# Patient Record
Sex: Female | Born: 1955 | Race: White | Hispanic: No | Marital: Married | Smoking: Never smoker
Health system: Southern US, Community
[De-identification: ages and names within clinical notes are randomized; demographics above are authoritative.]

## PROBLEM LIST (undated history)

## (undated) DIAGNOSIS — E079 Disorder of thyroid, unspecified: Secondary | ICD-10-CM

## (undated) DIAGNOSIS — E042 Nontoxic multinodular goiter: Secondary | ICD-10-CM

## (undated) DIAGNOSIS — Z973 Presence of spectacles and contact lenses: Secondary | ICD-10-CM

## (undated) HISTORY — PX: TUBAL LIGATION: SHX77

## (undated) HISTORY — PX: CHOLECYSTECTOMY: SHX55

## (undated) HISTORY — PX: TONSILLECTOMY: SUR1361

## (undated) HISTORY — PX: COLONOSCOPY: SHX174

## (undated) HISTORY — PX: LIPOMA EXCISION: SHX5283

---

## 1999-06-01 ENCOUNTER — Encounter: Payer: Self-pay | Admitting: Obstetrics and Gynecology

## 1999-06-01 ENCOUNTER — Encounter: Admission: RE | Admit: 1999-06-01 | Discharge: 1999-06-01 | Payer: Self-pay | Admitting: Obstetrics and Gynecology

## 2000-08-24 ENCOUNTER — Encounter: Admission: RE | Admit: 2000-08-24 | Discharge: 2000-08-24 | Payer: Self-pay | Admitting: Obstetrics and Gynecology

## 2000-08-24 ENCOUNTER — Encounter: Payer: Self-pay | Admitting: Obstetrics and Gynecology

## 2001-09-01 ENCOUNTER — Encounter: Payer: Self-pay | Admitting: Obstetrics and Gynecology

## 2001-09-01 ENCOUNTER — Encounter: Admission: RE | Admit: 2001-09-01 | Discharge: 2001-09-01 | Payer: Self-pay | Admitting: Obstetrics and Gynecology

## 2002-09-18 ENCOUNTER — Encounter: Admission: RE | Admit: 2002-09-18 | Discharge: 2002-09-18 | Payer: Self-pay | Admitting: Obstetrics and Gynecology

## 2002-09-18 ENCOUNTER — Encounter: Payer: Self-pay | Admitting: Obstetrics and Gynecology

## 2003-02-18 ENCOUNTER — Ambulatory Visit (HOSPITAL_COMMUNITY): Admission: RE | Admit: 2003-02-18 | Discharge: 2003-02-18 | Payer: Self-pay | Admitting: Family Medicine

## 2004-12-14 ENCOUNTER — Ambulatory Visit (HOSPITAL_COMMUNITY): Admission: RE | Admit: 2004-12-14 | Discharge: 2004-12-14 | Payer: Self-pay | Admitting: Family Medicine

## 2005-05-03 ENCOUNTER — Ambulatory Visit (HOSPITAL_COMMUNITY): Admission: RE | Admit: 2005-05-03 | Discharge: 2005-05-03 | Payer: Self-pay | Admitting: Pulmonary Disease

## 2005-05-04 ENCOUNTER — Ambulatory Visit (HOSPITAL_COMMUNITY): Admission: RE | Admit: 2005-05-04 | Discharge: 2005-05-04 | Payer: Self-pay | Admitting: Pulmonary Disease

## 2005-05-12 ENCOUNTER — Encounter (HOSPITAL_COMMUNITY): Admission: RE | Admit: 2005-05-12 | Discharge: 2005-06-11 | Payer: Self-pay | Admitting: Family Medicine

## 2005-08-02 ENCOUNTER — Encounter: Admission: RE | Admit: 2005-08-02 | Discharge: 2005-08-02 | Payer: Self-pay | Admitting: Endocrinology

## 2005-08-02 ENCOUNTER — Encounter (INDEPENDENT_AMBULATORY_CARE_PROVIDER_SITE_OTHER): Payer: Self-pay | Admitting: Specialist

## 2005-08-02 ENCOUNTER — Other Ambulatory Visit: Admission: RE | Admit: 2005-08-02 | Discharge: 2005-08-02 | Payer: Self-pay | Admitting: Interventional Radiology

## 2005-12-17 ENCOUNTER — Ambulatory Visit (HOSPITAL_COMMUNITY): Admission: RE | Admit: 2005-12-17 | Discharge: 2005-12-17 | Payer: Self-pay | Admitting: Family Medicine

## 2005-12-23 ENCOUNTER — Ambulatory Visit (HOSPITAL_COMMUNITY): Admission: RE | Admit: 2005-12-23 | Discharge: 2005-12-23 | Payer: Self-pay | Admitting: Family Medicine

## 2005-12-23 ENCOUNTER — Encounter: Payer: Self-pay | Admitting: Orthopedic Surgery

## 2005-12-30 ENCOUNTER — Ambulatory Visit: Payer: Self-pay | Admitting: Orthopedic Surgery

## 2006-01-27 ENCOUNTER — Ambulatory Visit: Payer: Self-pay | Admitting: Orthopedic Surgery

## 2006-08-17 ENCOUNTER — Encounter: Admission: RE | Admit: 2006-08-17 | Discharge: 2006-08-17 | Payer: Self-pay | Admitting: Endocrinology

## 2006-12-13 IMAGING — NM NM THYROID IMAGING W/ UPTAKE SINGLE (24 HR)
4 series · 4 of 4 positions shown · non-contrast
Comparison: No prior nuclear studies.

CLINICAL DATA: Right thyroid mass. 
 NUCLEAR MEDICINE THYROID SCAN AND THYROID RADIOIODINE UPTAKE:
TECHNIQUE: Following orally administered L-LRL, thyroid radioiodine uptake was calculated at 24 hours.  After intravenous injection of 1c-WWm pertechnetate, thyroid scan was performed.
 Radiopharmaceuticals:  15 uCi L-LRL and 10 mCi 1c-WWm pertechnetate.

[Series 1: th thyroid scan · 1.09mm/px · 1 of 1 slices shown (1 of 4)]
[im 1/1]
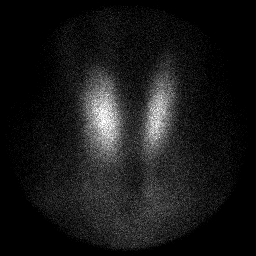

[Series 1: th thyroid scan · 2.39mm/px · 1 of 1 slices shown (2 of 4)]
[im 1/1]
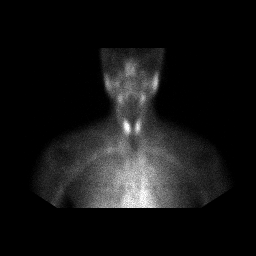

[Series 1: th thyroid scan · 1.09mm/px · 1 of 1 slices shown (3 of 4)]
[im 1/1]
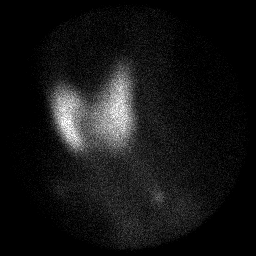

[Series 1: th thyroid scan · 1.09mm/px · 1 of 1 slices shown (4 of 4)]
[im 1/1]
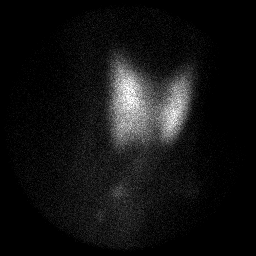

[4 of 4 positions shown; findings below may reference images not displayed]

Today?s exam is correlated with an ultrasound dated 05/12/05, and a CT of the C-spine dated 05/04/05.
FINDINGS: Radioactive iodine uptake is measured at 13.9 percent, 24 hours following L-LRL.  Normal is 10-35 percent.   
 The anatomical scan shows no focal masses, hot or cold.  Specifically, the dominant nodule in the lower aspect of the right lobe, is not visible on this exam.  It is highly unlikely for a nodule that is not cold on the scan, to be malignant.  
 The other smaller nodules are also not visible on this exam.
IMPRESSION: 1.   Radioactive iodine uptake is within normal limits.  
 2.  The anatomical scan shows no hot or cold nodules.  Specifically, the dominant nodule in the lower aspect of the right lobe, is not cold on this scan. See report.

## 2007-05-31 ENCOUNTER — Ambulatory Visit (HOSPITAL_COMMUNITY): Admission: RE | Admit: 2007-05-31 | Discharge: 2007-05-31 | Payer: Self-pay | Admitting: Family Medicine

## 2007-06-06 ENCOUNTER — Ambulatory Visit (HOSPITAL_COMMUNITY): Admission: RE | Admit: 2007-06-06 | Discharge: 2007-06-06 | Payer: Self-pay | Admitting: Internal Medicine

## 2007-06-28 ENCOUNTER — Ambulatory Visit: Payer: Self-pay | Admitting: Orthopedic Surgery

## 2007-06-28 DIAGNOSIS — M79609 Pain in unspecified limb: Secondary | ICD-10-CM | POA: Insufficient documentation

## 2007-06-29 ENCOUNTER — Telehealth: Payer: Self-pay | Admitting: Orthopedic Surgery

## 2007-07-05 ENCOUNTER — Telehealth: Payer: Self-pay | Admitting: Orthopedic Surgery

## 2007-07-06 ENCOUNTER — Encounter: Payer: Self-pay | Admitting: Orthopedic Surgery

## 2007-07-06 ENCOUNTER — Ambulatory Visit (HOSPITAL_COMMUNITY): Admission: RE | Admit: 2007-07-06 | Discharge: 2007-07-06 | Payer: Self-pay | Admitting: Orthopedic Surgery

## 2007-07-19 ENCOUNTER — Ambulatory Visit: Payer: Self-pay | Admitting: Orthopedic Surgery

## 2007-08-02 ENCOUNTER — Ambulatory Visit: Payer: Self-pay | Admitting: Orthopedic Surgery

## 2009-04-15 ENCOUNTER — Encounter: Admission: RE | Admit: 2009-04-15 | Discharge: 2009-04-15 | Payer: Self-pay | Admitting: Endocrinology

## 2009-11-17 ENCOUNTER — Ambulatory Visit (HOSPITAL_COMMUNITY): Admission: RE | Admit: 2009-11-17 | Discharge: 2009-11-17 | Payer: Self-pay | Admitting: Family Medicine

## 2010-03-15 ENCOUNTER — Encounter: Payer: Self-pay | Admitting: Orthopedic Surgery

## 2010-05-14 ENCOUNTER — Other Ambulatory Visit (HOSPITAL_COMMUNITY): Payer: Self-pay | Admitting: Endocrinology

## 2010-05-14 DIAGNOSIS — M8580 Other specified disorders of bone density and structure, unspecified site: Secondary | ICD-10-CM

## 2010-05-19 ENCOUNTER — Ambulatory Visit (HOSPITAL_COMMUNITY)
Admission: RE | Admit: 2010-05-19 | Discharge: 2010-05-19 | Disposition: A | Discharge status: Home / Self Care | Payer: BC Managed Care – PPO | Source: Ambulatory Visit | Attending: Endocrinology | Admitting: Endocrinology

## 2010-05-19 ENCOUNTER — Encounter (HOSPITAL_COMMUNITY): Payer: Self-pay

## 2010-05-19 DIAGNOSIS — M8580 Other specified disorders of bone density and structure, unspecified site: Secondary | ICD-10-CM

## 2010-05-19 DIAGNOSIS — Z780 Asymptomatic menopausal state: Secondary | ICD-10-CM | POA: Insufficient documentation

## 2010-05-19 DIAGNOSIS — M949 Disorder of cartilage, unspecified: Secondary | ICD-10-CM | POA: Insufficient documentation

## 2010-05-19 DIAGNOSIS — M899 Disorder of bone, unspecified: Secondary | ICD-10-CM | POA: Insufficient documentation

## 2010-12-21 ENCOUNTER — Other Ambulatory Visit (HOSPITAL_COMMUNITY): Payer: Self-pay | Admitting: Family Medicine

## 2010-12-21 ENCOUNTER — Ambulatory Visit (HOSPITAL_COMMUNITY)
Admission: RE | Admit: 2010-12-21 | Discharge: 2010-12-21 | Disposition: A | Discharge status: Home / Self Care | Payer: BC Managed Care – PPO | Source: Ambulatory Visit | Attending: Family Medicine | Admitting: Family Medicine

## 2010-12-21 DIAGNOSIS — M25549 Pain in joints of unspecified hand: Secondary | ICD-10-CM

## 2010-12-21 DIAGNOSIS — M19049 Primary osteoarthritis, unspecified hand: Secondary | ICD-10-CM

## 2011-02-03 ENCOUNTER — Other Ambulatory Visit (HOSPITAL_COMMUNITY): Payer: Self-pay | Admitting: Family Medicine

## 2011-02-03 DIAGNOSIS — J309 Allergic rhinitis, unspecified: Secondary | ICD-10-CM

## 2011-02-04 ENCOUNTER — Ambulatory Visit (HOSPITAL_COMMUNITY)
Admission: RE | Admit: 2011-02-04 | Discharge: 2011-02-04 | Disposition: A | Discharge status: Home / Self Care | Payer: BC Managed Care – PPO | Source: Ambulatory Visit | Attending: Family Medicine | Admitting: Family Medicine

## 2011-02-04 DIAGNOSIS — R059 Cough, unspecified: Secondary | ICD-10-CM | POA: Insufficient documentation

## 2011-02-04 DIAGNOSIS — R0989 Other specified symptoms and signs involving the circulatory and respiratory systems: Secondary | ICD-10-CM | POA: Insufficient documentation

## 2011-02-04 DIAGNOSIS — J309 Allergic rhinitis, unspecified: Secondary | ICD-10-CM

## 2011-02-04 DIAGNOSIS — R05 Cough: Secondary | ICD-10-CM | POA: Insufficient documentation

## 2011-02-19 ENCOUNTER — Ambulatory Visit (HOSPITAL_COMMUNITY)
Admission: RE | Admit: 2011-02-19 | Discharge: 2011-02-19 | Disposition: A | Discharge status: Home / Self Care | Payer: BC Managed Care – PPO | Source: Ambulatory Visit | Attending: Family Medicine | Admitting: Family Medicine

## 2011-02-19 ENCOUNTER — Other Ambulatory Visit (HOSPITAL_COMMUNITY): Payer: Self-pay | Admitting: Family Medicine

## 2011-02-19 DIAGNOSIS — S90129A Contusion of unspecified lesser toe(s) without damage to nail, initial encounter: Secondary | ICD-10-CM

## 2011-02-19 DIAGNOSIS — M79609 Pain in unspecified limb: Secondary | ICD-10-CM | POA: Insufficient documentation

## 2011-02-19 DIAGNOSIS — S92919A Unspecified fracture of unspecified toe(s), initial encounter for closed fracture: Secondary | ICD-10-CM | POA: Insufficient documentation

## 2011-02-19 DIAGNOSIS — X58XXXA Exposure to other specified factors, initial encounter: Secondary | ICD-10-CM | POA: Insufficient documentation

## 2011-05-18 ENCOUNTER — Other Ambulatory Visit (HOSPITAL_COMMUNITY): Payer: Self-pay | Admitting: Endocrinology

## 2011-05-18 DIAGNOSIS — E049 Nontoxic goiter, unspecified: Secondary | ICD-10-CM

## 2011-05-20 ENCOUNTER — Ambulatory Visit (HOSPITAL_COMMUNITY)
Admission: RE | Admit: 2011-05-20 | Discharge: 2011-05-20 | Disposition: A | Discharge status: Home / Self Care | Payer: BC Managed Care – PPO | Source: Ambulatory Visit | Attending: Endocrinology | Admitting: Endocrinology

## 2011-05-20 DIAGNOSIS — E049 Nontoxic goiter, unspecified: Secondary | ICD-10-CM

## 2011-07-13 ENCOUNTER — Other Ambulatory Visit: Payer: Self-pay | Admitting: Endocrinology

## 2011-07-13 DIAGNOSIS — E041 Nontoxic single thyroid nodule: Secondary | ICD-10-CM

## 2011-07-20 ENCOUNTER — Ambulatory Visit
Admission: RE | Admit: 2011-07-20 | Discharge: 2011-07-20 | Disposition: A | Discharge status: Home / Self Care | Payer: BC Managed Care – PPO | Source: Ambulatory Visit | Attending: Endocrinology | Admitting: Endocrinology

## 2011-07-20 ENCOUNTER — Other Ambulatory Visit (HOSPITAL_COMMUNITY)
Admission: RE | Admit: 2011-07-20 | Discharge: 2011-07-20 | Disposition: A | Discharge status: Home / Self Care | Payer: BC Managed Care – PPO | Source: Ambulatory Visit | Attending: Interventional Radiology | Admitting: Interventional Radiology

## 2011-07-20 DIAGNOSIS — E041 Nontoxic single thyroid nodule: Secondary | ICD-10-CM

## 2011-08-11 ENCOUNTER — Other Ambulatory Visit: Payer: Self-pay | Admitting: Obstetrics and Gynecology

## 2012-02-23 HISTORY — PX: BACK SURGERY: SHX140

## 2012-03-04 ENCOUNTER — Encounter (HOSPITAL_COMMUNITY): Payer: Self-pay

## 2012-03-04 ENCOUNTER — Emergency Department (HOSPITAL_COMMUNITY)
Admission: EM | Admit: 2012-03-04 | Discharge: 2012-03-04 | Disposition: A | Discharge status: Home / Self Care | Payer: BC Managed Care – PPO | Attending: Emergency Medicine | Admitting: Emergency Medicine

## 2012-03-04 DIAGNOSIS — R21 Rash and other nonspecific skin eruption: Secondary | ICD-10-CM | POA: Insufficient documentation

## 2012-03-04 DIAGNOSIS — T40605A Adverse effect of unspecified narcotics, initial encounter: Secondary | ICD-10-CM | POA: Insufficient documentation

## 2012-03-04 DIAGNOSIS — E079 Disorder of thyroid, unspecified: Secondary | ICD-10-CM | POA: Insufficient documentation

## 2012-03-04 DIAGNOSIS — Z79899 Other long term (current) drug therapy: Secondary | ICD-10-CM | POA: Insufficient documentation

## 2012-03-04 DIAGNOSIS — T7840XA Allergy, unspecified, initial encounter: Secondary | ICD-10-CM

## 2012-03-04 DIAGNOSIS — T40601A Poisoning by unspecified narcotics, accidental (unintentional), initial encounter: Secondary | ICD-10-CM | POA: Insufficient documentation

## 2012-03-04 HISTORY — DX: Disorder of thyroid, unspecified: E07.9

## 2012-03-04 MED ORDER — HYDROMORPHONE HCL PF 1 MG/ML IJ SOLN
1.0000 mg | Freq: Once | INTRAMUSCULAR | Status: AC
  Administered 2012-03-04: 1 mg via INTRAVENOUS
  Filled 2012-03-04: qty 1

## 2012-03-04 MED ORDER — FAMOTIDINE 20 MG PO TABS: 20.0000 mg | ORAL_TABLET | Freq: Two times a day (BID) | ORAL | Status: DC

## 2012-03-04 MED ORDER — DIPHENHYDRAMINE HCL 50 MG/ML IJ SOLN
50.0000 mg | Freq: Once | INTRAMUSCULAR | Status: AC
  Administered 2012-03-04: 50 mg via INTRAVENOUS
  Filled 2012-03-04: qty 1

## 2012-03-04 MED ORDER — HYDROCODONE-ACETAMINOPHEN 5-325 MG PO TABS: 1.0000 | ORAL_TABLET | Freq: Four times a day (QID) | ORAL | Status: DC | PRN

## 2012-03-04 MED ORDER — METHYLPREDNISOLONE SODIUM SUCC 125 MG IJ SOLR
125.0000 mg | Freq: Once | INTRAMUSCULAR | Status: AC
  Administered 2012-03-04: 125 mg via INTRAVENOUS
  Filled 2012-03-04: qty 2

## 2012-03-04 MED ORDER — FAMOTIDINE IN NACL 20-0.9 MG/50ML-% IV SOLN
20.0000 mg | Freq: Once | INTRAVENOUS | Status: AC
  Administered 2012-03-04: 20 mg via INTRAVENOUS
  Filled 2012-03-04: qty 50

## 2012-03-04 NOTE — ED Provider Notes (Signed)
History     CSN: [id number]  Arrival date & time 03/04/12  1647   First MD Initiated Contact with Patient 03/04/12 1714      Chief Complaint  Patient presents with  . Allergic Reaction    (Consider location/radiation/quality/duration/timing/severity/associated sxs/prior treatment) Patient is a 57 y.o. female presenting with allergic reaction. The history is provided by the patient (the pt complains of a rash, puritic on her back). No language interpreter was used.  Allergic Reaction The primary symptoms are  rash. The primary symptoms do not include wheezing, cough, abdominal pain or diarrhea. The current episode started 3 to 5 hours ago. The problem has not changed since onset.This is a new problem.  The onset of the reaction was associated with a new medication. Significant symptoms that are not present include eye redness.    Past Medical History  Diagnosis Date  . Thyroid disease     Past Surgical History  Procedure Date  . Back surgery   . Tubal ligation   . Cholecystectomy     No family history on file.  History  Substance Use Topics  . Smoking status: Never Smoker   . Smokeless tobacco: Not on file  . Alcohol Use: No    OB History    Grav Para Term Preterm Abortions TAB SAB Ect Mult Living                  Review of Systems  Constitutional: Negative for fatigue.  HENT: Negative for congestion, sinus pressure and ear discharge.   Eyes: Negative for discharge and redness.  Respiratory: Negative for cough and wheezing.   Cardiovascular: Negative for chest pain.  Gastrointestinal: Negative for abdominal pain and diarrhea.  Genitourinary: Negative for frequency and hematuria.  Musculoskeletal: Negative for back pain.  Skin: Positive for rash.  Neurological: Negative for seizures and headaches.  Hematological: Negative.   Psychiatric/Behavioral: Negative for hallucinations.    Allergies  Alendronate sodium; Calcium-containing compounds; Imitrex; and  Tetracycline  Home Medications   Current Outpatient Rx  Name  Route  Sig  Dispense  Refill  . ALBUTEROL SULFATE HFA 108 (90 BASE) MCG/ACT IN AERS   Inhalation   Inhale 2 puffs into the lungs every 6 (six) hours as needed. Shortness of breath         . CYCLOSPORINE 0.05 % OP EMUL   Both Eyes   Place 1 drop into both eyes 2 (two) times daily.         Marland Kitchen DIPHENHYDRAMINE HCL 25 MG PO CAPS   Oral   Take 25 mg by mouth every 6 (six) hours as needed. sleep         . FLUTICASONE FUROATE 27.5 MCG/SPRAY NA SUSP   Nasal   Place 2 sprays into the nose daily.         Marland Kitchen GABAPENTIN 300 MG PO CAPS   Oral   Take 300 mg by mouth daily.         . MOMETASONE FUROATE 0.1 % EX SOLN   Topical   Apply 1 application topically daily as needed. Itching/dryness         . MONTELUKAST SODIUM 10 MG PO TABS   Oral   Take 10 mg by mouth at bedtime.         Marland Kitchen FISH OIL 1000 MG PO CAPS   Oral   Take 2,000 mg by mouth daily.         . OXYCODONE HCL 5 MG PO TABS  Oral   Take 5 mg by mouth every 4 (four) hours as needed. Pain( 1 tab-mild pain, 2 tab-moderate pain, 3 tab-severe pain)         . SENNA-DOCUSATE SODIUM 8.6-50 MG PO TABS   Oral   Take 2 tablets by mouth 2 (two) times daily.         . TERIPARATIDE (RECOMBINANT) 600 MCG/2.4ML [place] SOLN   Subcutaneous   Inject 20 mcg into the skin daily.         Marland Kitchen FAMOTIDINE 20 MG PO TABS   Oral   Take 1 tablet (20 mg total) by mouth 2 (two) times daily.   10 tablet   0     BP 128/72  Pulse 96  Temp 99.1 F (37.3 C) (Oral)  Resp 20  SpO2 100%  Physical Exam  Constitutional: She is oriented to person, place, and time. She appears well-developed.  HENT:  Head: Normocephalic and atraumatic.  Eyes: Conjunctivae normal and EOM are normal. No scleral icterus.  Neck: Neck supple. No thyromegaly present.  Cardiovascular: Normal rate and regular rhythm.  Exam reveals no gallop and no friction rub.   No murmur  heard. Pulmonary/Chest: No stridor. She has no wheezes. She has no rales. She exhibits no tenderness.  Abdominal: She exhibits no distension. There is no tenderness. There is no rebound.  Musculoskeletal: Normal range of motion. She exhibits no edema.  Lymphadenopathy:    She has no cervical adenopathy.  Neurological: She is oriented to person, place, and time. Coordination normal.  Skin: Rash noted. There is erythema.       Allergic rash to back  Psychiatric: She has a normal mood and affect. Her behavior is normal.    ED Course  Procedures (including critical care time)  Labs Reviewed - No data to display No results found.   1. Allergic reaction     Pt improved with tx  MDM          Benny Lennert, MD 03/04/12 [phone]

## 2012-03-04 NOTE — ED Notes (Signed)
Pt reports that she had back surgery om Monday at [facility], d.c home Thursday,   Now has a rash to back area and thinks she may be having allergic reaction to pain meds, oxycodone.

## 2012-08-10 ENCOUNTER — Other Ambulatory Visit: Payer: Self-pay | Admitting: Obstetrics and Gynecology

## 2012-08-22 ENCOUNTER — Other Ambulatory Visit: Payer: Self-pay | Admitting: Obstetrics and Gynecology

## 2012-08-22 DIAGNOSIS — R928 Other abnormal and inconclusive findings on diagnostic imaging of breast: Secondary | ICD-10-CM

## 2012-09-04 ENCOUNTER — Ambulatory Visit
Admission: RE | Admit: 2012-09-04 | Discharge: 2012-09-04 | Disposition: A | Discharge status: Home / Self Care | Payer: BC Managed Care – PPO | Source: Ambulatory Visit | Attending: Obstetrics and Gynecology | Admitting: Obstetrics and Gynecology

## 2012-09-04 DIAGNOSIS — R928 Other abnormal and inconclusive findings on diagnostic imaging of breast: Secondary | ICD-10-CM

## 2012-12-27 ENCOUNTER — Other Ambulatory Visit (HOSPITAL_COMMUNITY): Payer: Self-pay | Admitting: Endocrinology

## 2012-12-27 DIAGNOSIS — E049 Nontoxic goiter, unspecified: Secondary | ICD-10-CM

## 2013-01-03 ENCOUNTER — Other Ambulatory Visit (HOSPITAL_COMMUNITY): Payer: Self-pay | Admitting: Endocrinology

## 2013-01-03 DIAGNOSIS — M818 Other osteoporosis without current pathological fracture: Secondary | ICD-10-CM

## 2013-06-25 ENCOUNTER — Other Ambulatory Visit (HOSPITAL_COMMUNITY): Payer: BC Managed Care – PPO

## 2013-06-29 ENCOUNTER — Ambulatory Visit (HOSPITAL_COMMUNITY)
Admission: RE | Admit: 2013-06-29 | Discharge: 2013-06-29 | Disposition: A | Discharge status: Home / Self Care | Payer: BC Managed Care – PPO | Source: Ambulatory Visit | Attending: Endocrinology | Admitting: Endocrinology

## 2013-06-29 DIAGNOSIS — M949 Disorder of cartilage, unspecified: Principal | ICD-10-CM

## 2013-06-29 DIAGNOSIS — Z780 Asymptomatic menopausal state: Secondary | ICD-10-CM | POA: Insufficient documentation

## 2013-06-29 DIAGNOSIS — E049 Nontoxic goiter, unspecified: Secondary | ICD-10-CM | POA: Insufficient documentation

## 2013-06-29 DIAGNOSIS — M899 Disorder of bone, unspecified: Secondary | ICD-10-CM | POA: Insufficient documentation

## 2013-06-29 DIAGNOSIS — M818 Other osteoporosis without current pathological fracture: Secondary | ICD-10-CM

## 2013-12-18 ENCOUNTER — Other Ambulatory Visit: Payer: Self-pay | Admitting: Orthopedic Surgery

## 2013-12-25 ENCOUNTER — Encounter (HOSPITAL_BASED_OUTPATIENT_CLINIC_OR_DEPARTMENT_OTHER): Payer: Self-pay | Admitting: *Deleted

## 2013-12-27 ENCOUNTER — Ambulatory Visit (HOSPITAL_BASED_OUTPATIENT_CLINIC_OR_DEPARTMENT_OTHER): Payer: BC Managed Care – PPO | Admitting: Anesthesiology

## 2013-12-27 ENCOUNTER — Encounter (HOSPITAL_BASED_OUTPATIENT_CLINIC_OR_DEPARTMENT_OTHER): Payer: Self-pay | Admitting: *Deleted

## 2013-12-27 ENCOUNTER — Ambulatory Visit (HOSPITAL_BASED_OUTPATIENT_CLINIC_OR_DEPARTMENT_OTHER)
Admission: RE | Admit: 2013-12-27 | Discharge: 2013-12-27 | Disposition: A | Discharge status: Home / Self Care | Payer: BC Managed Care – PPO | Source: Ambulatory Visit | Attending: Orthopedic Surgery | Admitting: Orthopedic Surgery

## 2013-12-27 ENCOUNTER — Encounter (HOSPITAL_BASED_OUTPATIENT_CLINIC_OR_DEPARTMENT_OTHER)
Admission: RE | Disposition: A | Discharge status: Home / Self Care | Payer: Self-pay | Source: Ambulatory Visit | Attending: Orthopedic Surgery

## 2013-12-27 DIAGNOSIS — Z885 Allergy status to narcotic agent status: Secondary | ICD-10-CM | POA: Insufficient documentation

## 2013-12-27 DIAGNOSIS — M65841 Other synovitis and tenosynovitis, right hand: Secondary | ICD-10-CM | POA: Diagnosis not present

## 2013-12-27 DIAGNOSIS — Z888 Allergy status to other drugs, medicaments and biological substances status: Secondary | ICD-10-CM | POA: Diagnosis not present

## 2013-12-27 DIAGNOSIS — Z91013 Allergy to seafood: Secondary | ICD-10-CM | POA: Insufficient documentation

## 2013-12-27 DIAGNOSIS — Z91040 Latex allergy status: Secondary | ICD-10-CM | POA: Diagnosis not present

## 2013-12-27 DIAGNOSIS — Z881 Allergy status to other antibiotic agents status: Secondary | ICD-10-CM | POA: Insufficient documentation

## 2013-12-27 HISTORY — DX: Presence of spectacles and contact lenses: Z97.3

## 2013-12-27 HISTORY — PX: TRIGGER FINGER RELEASE: SHX641

## 2013-12-27 LAB — POCT HEMOGLOBIN-HEMACUE: Hemoglobin: 14.7 g/dL (ref 12.0–15.0)

## 2013-12-27 SURGERY — RELEASE, A1 PULLEY, FOR TRIGGER FINGER
Anesthesia: Regional | Site: Finger | Laterality: Right

## 2013-12-27 MED ORDER — TRAMADOL HCL 50 MG PO TABS: 50.0000 mg | ORAL_TABLET | Freq: Four times a day (QID) | ORAL | Status: DC | PRN

## 2013-12-27 MED ORDER — FENTANYL CITRATE 0.05 MG/ML IJ SOLN
INTRAMUSCULAR | Status: AC
  Filled 2013-12-27: qty 2

## 2013-12-27 MED ORDER — BUPIVACAINE HCL 0.25 % IJ SOLN
INTRAMUSCULAR | Status: DC | PRN
  Administered 2013-12-27: 6 mL

## 2013-12-27 MED ORDER — PROPOFOL 10 MG/ML IV BOLUS
INTRAVENOUS | Status: AC
  Filled 2013-12-27: qty 20

## 2013-12-27 MED ORDER — FENTANYL CITRATE 0.05 MG/ML IJ SOLN
INTRAMUSCULAR | Status: AC
  Filled 2013-12-27: qty 4

## 2013-12-27 MED ORDER — CEFAZOLIN SODIUM-DEXTROSE 2-3 GM-% IV SOLR: 2.0000 g | INTRAVENOUS | Status: DC

## 2013-12-27 MED ORDER — CHLORHEXIDINE GLUCONATE 4 % EX LIQD: 60.0000 mL | Freq: Once | CUTANEOUS | Status: DC

## 2013-12-27 MED ORDER — LACTATED RINGERS IV SOLN
INTRAVENOUS | Status: DC
  Administered 2013-12-27: 12:00:00 via INTRAVENOUS

## 2013-12-27 MED ORDER — MIDAZOLAM HCL 5 MG/5ML IJ SOLN
INTRAMUSCULAR | Status: DC | PRN
  Administered 2013-12-27: 2 mg via INTRAVENOUS

## 2013-12-27 MED ORDER — CHLORHEXIDINE GLUCONATE 4 % EX LIQD
60.0000 mL | Freq: Once | CUTANEOUS | Status: DC
Start: 2013-12-27 — End: 2013-12-27

## 2013-12-27 MED ORDER — MIDAZOLAM HCL 2 MG/2ML IJ SOLN
INTRAMUSCULAR | Status: AC
  Filled 2013-12-27: qty 2

## 2013-12-27 MED ORDER — PROPOFOL 10 MG/ML IV BOLUS
INTRAVENOUS | Status: DC | PRN
  Administered 2013-12-27: 40 mg via INTRAVENOUS

## 2013-12-27 MED ORDER — FENTANYL CITRATE 0.05 MG/ML IJ SOLN
INTRAMUSCULAR | Status: DC | PRN
  Administered 2013-12-27: 100 ug via INTRAVENOUS

## 2013-12-27 MED ORDER — ONDANSETRON HCL 4 MG/2ML IJ SOLN
INTRAMUSCULAR | Status: DC | PRN
  Administered 2013-12-27: 4 mg via INTRAVENOUS

## 2013-12-27 MED ORDER — MIDAZOLAM HCL 2 MG/2ML IJ SOLN: 1.0000 mg | INTRAMUSCULAR | Status: DC | PRN

## 2013-12-27 MED ORDER — FENTANYL CITRATE 0.05 MG/ML IJ SOLN: 50.0000 ug | INTRAMUSCULAR | Status: DC | PRN

## 2013-12-27 MED ORDER — BUPIVACAINE HCL (PF) 0.25 % IJ SOLN
INTRAMUSCULAR | Status: AC
  Filled 2013-12-27: qty 30

## 2013-12-27 MED ORDER — 0.9 % SODIUM CHLORIDE (POUR BTL) OPTIME
TOPICAL | Status: DC | PRN
  Administered 2013-12-27: 1000 mL

## 2013-12-27 SURGICAL SUPPLY — 34 items
BANDAGE COBAN STERILE 2 (GAUZE/BANDAGES/DRESSINGS) ×3 IMPLANT
BLADE SURG 15 STRL LF DISP TIS (BLADE) ×1 IMPLANT
BLADE SURG 15 STRL SS (BLADE) ×2
BNDG COHESIVE 3X5 TAN STRL LF (GAUZE/BANDAGES/DRESSINGS) ×3 IMPLANT
BNDG ESMARK 4X9 LF (GAUZE/BANDAGES/DRESSINGS) IMPLANT
BNDG GAUZE ELAST 4 BULKY (GAUZE/BANDAGES/DRESSINGS) ×3 IMPLANT
CHLORAPREP W/TINT 26ML (MISCELLANEOUS) ×3 IMPLANT
CORDS BIPOLAR (ELECTRODE) IMPLANT
COVER BACK TABLE 60X90IN (DRAPES) ×3 IMPLANT
COVER MAYO STAND STRL (DRAPES) ×3 IMPLANT
CUFF TOURNIQUET SINGLE 18IN (TOURNIQUET CUFF) IMPLANT
DECANTER SPIKE VIAL GLASS SM (MISCELLANEOUS) IMPLANT
DRAPE EXTREMITY T 121X128X90 (DRAPE) ×3 IMPLANT
DRAPE SURG 17X23 STRL (DRAPES) ×3 IMPLANT
GAUZE SPONGE 4X4 12PLY STRL (GAUZE/BANDAGES/DRESSINGS) ×3 IMPLANT
GAUZE XEROFORM 1X8 LF (GAUZE/BANDAGES/DRESSINGS) ×3 IMPLANT
GLOVE BIOGEL PI IND STRL 8.5 (GLOVE) ×1 IMPLANT
GLOVE BIOGEL PI INDICATOR 8.5 (GLOVE) ×2
GLOVE SURG ORTHO 8.0 STRL STRW (GLOVE) ×3 IMPLANT
GOWN STRL REUS W/ TWL LRG LVL3 (GOWN DISPOSABLE) ×1 IMPLANT
GOWN STRL REUS W/TWL LRG LVL3 (GOWN DISPOSABLE) ×2
GOWN STRL REUS W/TWL XL LVL3 (GOWN DISPOSABLE) ×3 IMPLANT
NEEDLE 27GAX1X1/2 (NEEDLE) ×3 IMPLANT
NS IRRIG 1000ML POUR BTL (IV SOLUTION) ×3 IMPLANT
PACK BASIN DAY SURGERY FS (CUSTOM PROCEDURE TRAY) ×3 IMPLANT
PADDING CAST ABS 4INX4YD NS (CAST SUPPLIES) ×2
PADDING CAST ABS COTTON 4X4 ST (CAST SUPPLIES) ×1 IMPLANT
SPONGE GAUZE 4X4 12PLY STER LF (GAUZE/BANDAGES/DRESSINGS) ×3 IMPLANT
STOCKINETTE 4X48 STRL (DRAPES) ×3 IMPLANT
SUT VICRYL RAPIDE 4/0 PS 2 (SUTURE) ×3 IMPLANT
SYR BULB 3OZ (MISCELLANEOUS) ×3 IMPLANT
SYR CONTROL 10ML LL (SYRINGE) ×3 IMPLANT
TOWEL OR 17X24 6PK STRL BLUE (TOWEL DISPOSABLE) ×6 IMPLANT
UNDERPAD 30X30 INCONTINENT (UNDERPADS AND DIAPERS) ×3 IMPLANT

## 2013-12-27 NOTE — Anesthesia Procedure Notes (Signed)
Procedure Name: MAC Date/Time: 12/27/2013 1:05 PM Performed by: Zenia ResidesPAYNE, Quasean Frye D Pre-anesthesia Checklist: Patient identified, Timeout performed, Emergency Drugs available, Suction available and Patient being monitored Oxygen Delivery Method: Simple face mask

## 2013-12-27 NOTE — Brief Op Note (Signed)
12/27/2013  1:39 PM  PATIENT:  Reyne DumasJane H Stehr  58 y.o. female  PRE-OPERATIVE DIAGNOSIS:  trigger right middle finger  POST-OPERATIVE DIAGNOSIS:  trigger right middle finger  PROCEDURE:  Procedure(s): RELEASE TRIGGER FINGER/A-1 PULLEY RIGHT MIDDLE FINGER (Right)  SURGEON:  Surgeon(s) and Role:    * Cindee SaltGary Baylor Teegarden, MD - Primary  PHYSICIAN ASSISTANT:   ASSISTANTS: none   ANESTHESIA:   regional  And local  EBL:  Total I/O In: 800 [I.V.:800] Out: -   BLOOD ADMINISTERED:none  DRAINS: none   LOCAL MEDICATIONS USED:  BUPIVICAINE   SPECIMEN:  No Specimen  DISPOSITION OF SPECIMEN:  N/A  COUNTS:  YES  TOURNIQUET:   Total Tourniquet Time Documented: Forearm (Right) - 17 minutes Total: Forearm (Right) - 17 minutes   DICTATION: .Other Dictation: Dictation Number [id number]  PLAN OF CARE: Discharge to home after PACU  PATIENT DISPOSITION:  PACU - hemodynamically stable.

## 2013-12-27 NOTE — Anesthesia Preprocedure Evaluation (Addendum)
Anesthesia Evaluation  Patient identified by MRN, date of birth, ID band Patient awake    Reviewed: Allergy & Precautions, H&P , NPO status , Patient's Chart, lab work & pertinent test results  Airway Mallampati: I TM Distance: >3 FB Neck ROM: Full    Dental   Pulmonary          Cardiovascular     Neuro/Psych    GI/Hepatic   Endo/Other    Renal/GU      Musculoskeletal   Abdominal   Peds  Hematology   Anesthesia Other Findings   Reproductive/Obstetrics                           Anesthesia Physical Anesthesia Plan  ASA: II  Anesthesia Plan: Bier Block   Post-op Pain Management:    Induction: Intravenous  Airway Management Planned: Simple Face Mask  Additional Equipment:   Intra-op Plan:   Post-operative Plan:   Informed Consent: I have reviewed the patients History and Physical, chart, labs and discussed the procedure including the risks, benefits and alternatives for the proposed anesthesia with the patient or authorized representative who has indicated his/her understanding and acceptance.     Plan Discussed with: CRNA and Surgeon  Anesthesia Plan Comments:         Anesthesia Quick Evaluation  

## 2013-12-27 NOTE — Discharge Instructions (Addendum)

## 2013-12-27 NOTE — Op Note (Signed)
[name]:  Lynch, Beth                ACCOUNT NO.:  [id number]  MEDICAL RECORD NO.:  [id number]  LOCATION:                                 FACILITY:  PHYSICIAN:  [name], M.D.       DATE OF BIRTH:  08-29-1955  DATE OF PROCEDURE:  12/27/2013 DATE OF DISCHARGE:                              OPERATIVE REPORT   PREOPERATIVE DIAGNOSIS:  Stenosing tenosynovitis, right middle finger.  POSTOPERATIVE DIAGNOSIS:  Stenosing tenosynovitis, right middle finger.  OPERATION:  Release of A1 pulley, right middle finger.  SURGEON:  [name], M.D.  ANESTHESIA:  Forearm-based IV regional with local infiltration.  ANESTHESIOLOGIST:  [name], M.D.  HISTORY:  The patient is a 58 year old female with a history of triggering of her right middle finger.  This has been injected on 2 occasions without relief.  She has elected to undergo surgical release. Pre, peri, postoperative course were discussed along with risks and complications.  She is aware that there is no guarantee with the surgery, possibility of infection; recurrence of injury to arteries, nerves, tendons, incomplete release of symptoms; and dystrophy.  In the preoperative area, the patient is seen, the extremity marked by both the patient and surgeon.  Antibiotic given.  PROCEDURE IN DETAIL:  The patient was brought to the operating room, where forearm-based IV regional anesthetic was carried out without difficulty.  She was prepped using ChloraPrep, supine position with the right arm free.  A 3-minute dry time was allowed.  Time-out taken, confirming the patient and procedure.  An oblique incision was made over the A1 pulley of the right middle finger, carried down through subcutaneous tissue.  The A1 pulley was identified, a cyst was present. This was placed.  An incision was then made on its radial aspect and it went directly through the base of the cyst releasing the A1 pulley.  A small incision was made centrally in A2.   Partial tenosynovectomy was performed proximally for adherence between the tenosynovium tissues surrounding the superficialis and profundus.  The flexor tendons separated, the finger placed through a full range motion, no further triggering was noted.  The wound was copiously irrigated with saline. Skin then closed with interrupted 4-0 Vicryl Rapide sutures.  A local infiltration of 0.25% Marcaine without epinephrine was given, approximately 5 mL was used.  Sterile compressive dressing with the fingers free was applied. On deflation of the tourniquet, all fingers immediately pinked.  She was taken to the recovery room for observation in a satisfactory condition. She will be discharged home [facility] [place] [facility].          ______________________________ [name], M.D.     GK/MEDQ  D:  12/27/2013  T:  12/27/2013  Job:  [id number]

## 2013-12-27 NOTE — Anesthesia Postprocedure Evaluation (Signed)
Anesthesia Post Note  Patient: Beth DumasJane H Lynch  Procedure(s) Performed: Procedure(s) (LRB): RELEASE TRIGGER FINGER/A-1 PULLEY RIGHT MIDDLE FINGER (Right)  Anesthesia type: general  Patient location: PACU  Post pain: Pain level controlled  Post assessment: Patient's Cardiovascular Status Stable  Last Vitals:  Filed Vitals:   12/27/13 1433  BP: 111/62  Pulse: 66  Temp: 36.5 C  Resp: 16    Post vital signs: Reviewed and stable  Level of consciousness: sedated  Complications: No apparent anesthesia complications

## 2013-12-27 NOTE — H&P (Signed)
Beth Lynch is a 58 year old right hand dominant female with triggering of her right middle finger, pain in her left thenar eminence. The triggering has been going on for a year and worse over the past 6 months. She is complaining of intermittent, severe, sharp, burning type pain with a feeling of swelling. She was placed in a thumb spica splint by Dr. Hilma Favors. She says however her thumb is getting worse along with the finger. Activity, exercise and work makes this worse.This has been injected on 2 occasions. This continues to catch for her  PAST MEDICAL HISTORY: She has an allergy to Aleve, Advil, Meloxicam which she has taken in the past and these caused a rash for her. She has the following drug allergies: tetracycline, Fosamax, Imitrex, shellfish containing products, bee sting kit, oxycodone, hydrocodone/acetaminophen, and calcium. She has had a lumbar fusion in 2014. She feels like the exercise and activity and rehabilitation have aggravated both problems. She is on the following medications: Restasis, Nasacort, and Elocon ear drops, Allegra, Benadryl and Tylenol. She has had a tonsillectomy, tubal ligation, cholecystectomy, excision of shoulder tumor, posterior lumbar laminectomy and fusion.  FAMILY H ISTORY: Positive for heart disease, high BP and arthritis.   SOCIAL HISTORY: She does not smoke. She drinks socially. She is married and a retired Oncologist.  REVIEW OF SYSTEMS: Positive for glasses, contacts, hearing loss, pneumonia, rash, headaches, otherwise negative for 14 points Beth Lynch is an 58 y.o. female.   Chief Complaint: Trigger right long (middle)  finger HPI: see above  Past Medical History  Diagnosis Date  . Thyroid disease   . Wears glasses     Past Surgical History  Procedure Laterality Date  . Tubal ligation    . Cholecystectomy    . Back surgery  2014    lumb fusion  . Tonsillectomy    . Colonoscopy    . Lipoma excision      shoulder    History  reviewed. No pertinent family history. Social History:  reports that she has never smoked. She does not have any smokeless tobacco history on file. She reports that she drinks alcohol. She reports that she does not use illicit drugs.  Allergies:  Allergies  Allergen Reactions  . Shellfish Allergy Anaphylaxis  . Alendronate Sodium Hives  . Calcium-Containing Compounds Hives  . Codeine Hives  . Imitrex [Sumatriptan]     Breast lump  . Latex Hives  . Nsaids Hives  . Betadine [Povidone Iodine] Rash  . Hydrocodone Hives, Itching and Rash  . Oxycodone Hives, Itching and Rash  . Tetracycline Rash    Medications Prior to Admission  Medication Sig Dispense Refill  . azelastine (ASTELIN) 0.1 % nasal spray Place into both nostrils 2 (two) times daily. Use in each nostril as directed    . Beclomethasone Dipropionate (QNASL) 80 MCG/ACT AERS Place into the nose every morning.    . cycloSPORINE (RESTASIS) 0.05 % ophthalmic emulsion Place 1 drop into both eyes 2 (two) times daily.    Marland Kitchen desloratadine (CLARINEX) 5 MG tablet Take 5 mg by mouth daily.    . diphenhydrAMINE (BENADRYL) 25 mg capsule Take 25 mg by mouth every 6 (six) hours as needed. sleep      No results found for this or any previous visit (from the past 48 hour(s)).  No results found.   Pertinent items are noted in HPI.  Blood pressure 119/73, pulse 62, temperature 97.7 F (36.5 C), temperature source Oral, resp. rate 20,  height _0  (1.676 m), weight 58.514 kg (129 lb), SpO2 100 %.  General appearance: alert, cooperative and appears stated age Head: Normocephalic, without obvious abnormality Neck: no JVD Resp: clear to auscultation bilaterally Cardio: regular rate and rhythm, S1, S2 normal, no murmur, click, rub or gallop GI: soft, non-tender; bowel sounds normal; no masses,  no organomegaly Extremities: trigger right middle finger Pulses: 2+ and symmetric Skin: Skin color, texture, turgor normal. No rashes or  lesions Neurologic: Grossly normal Incision/Wound: na  Assessment/Plan Diagnosis: STS right middle finger She is scheduled for right trigger finger release as an outpatient under regional anesthesia. Pre, peri and post op care are discussed along with risks and complications. Patient is aware there is no guarantee with surgery, possibility of infection, injury to arteries, nerves, and tendons, incomplete relief and dystrophy. She is scheduled for right middle finger trigger release.  Beth Lynch 12/27/2013, 11:42 AM

## 2013-12-27 NOTE — Op Note (Signed)
Dictation Number [id number]

## 2013-12-27 NOTE — Transfer of Care (Signed)
Immediate Anesthesia Transfer of Care Note  Patient: Beth DumasJane H Schwalm  Procedure(s) Performed: Procedure(s): RELEASE TRIGGER FINGER/A-1 PULLEY RIGHT MIDDLE FINGER (Right)  Patient Location: PACU  Anesthesia Type:MAC and Bier block  Level of Consciousness: awake, alert  and oriented  Airway & Oxygen Therapy: Patient Spontanous Breathing and Patient connected to face mask oxygen  Post-op Assessment: Report given to PACU RN and Post -op Vital signs reviewed and stable  Post vital signs: Reviewed and stable  Complications: No apparent anesthesia complications

## 2013-12-28 ENCOUNTER — Encounter (HOSPITAL_BASED_OUTPATIENT_CLINIC_OR_DEPARTMENT_OTHER): Payer: Self-pay | Admitting: Orthopedic Surgery

## 2014-01-01 NOTE — Addendum Note (Signed)
Addendum  created 01/01/14 0750 by Arta BruceKevin Ladesha Pacini, MD   Modules edited: Anesthesia Responsible Staff, Clinical Notes   Clinical Notes:  File: [id number]

## 2014-01-28 ENCOUNTER — Other Ambulatory Visit: Payer: Self-pay | Admitting: Obstetrics and Gynecology

## 2014-01-29 LAB — CYTOLOGY - PAP

## 2014-01-30 ENCOUNTER — Other Ambulatory Visit: Payer: Self-pay | Admitting: Obstetrics and Gynecology

## 2014-01-30 DIAGNOSIS — R928 Other abnormal and inconclusive findings on diagnostic imaging of breast: Secondary | ICD-10-CM

## 2014-02-18 ENCOUNTER — Ambulatory Visit
Admission: RE | Admit: 2014-02-18 | Discharge: 2014-02-18 | Disposition: A | Discharge status: Home / Self Care | Payer: BC Managed Care – PPO | Source: Ambulatory Visit | Attending: Obstetrics and Gynecology | Admitting: Obstetrics and Gynecology

## 2014-02-18 DIAGNOSIS — R928 Other abnormal and inconclusive findings on diagnostic imaging of breast: Secondary | ICD-10-CM

## 2014-05-27 ENCOUNTER — Other Ambulatory Visit: Payer: Self-pay | Admitting: Obstetrics and Gynecology

## 2014-05-27 DIAGNOSIS — R921 Mammographic calcification found on diagnostic imaging of breast: Secondary | ICD-10-CM

## 2014-08-21 ENCOUNTER — Ambulatory Visit
Admission: RE | Admit: 2014-08-21 | Discharge: 2014-08-21 | Disposition: A | Discharge status: Home / Self Care | Payer: BC Managed Care – PPO | Source: Ambulatory Visit | Attending: Obstetrics and Gynecology | Admitting: Obstetrics and Gynecology

## 2014-08-21 DIAGNOSIS — R921 Mammographic calcification found on diagnostic imaging of breast: Secondary | ICD-10-CM

## 2014-11-05 ENCOUNTER — Other Ambulatory Visit: Payer: Self-pay

## 2014-11-05 DIAGNOSIS — R921 Mammographic calcification found on diagnostic imaging of breast: Secondary | ICD-10-CM

## 2014-11-05 DIAGNOSIS — Z803 Family history of malignant neoplasm of breast: Secondary | ICD-10-CM

## 2015-01-09 ENCOUNTER — Other Ambulatory Visit (HOSPITAL_COMMUNITY): Payer: Self-pay | Admitting: Endocrinology

## 2015-01-09 DIAGNOSIS — E049 Nontoxic goiter, unspecified: Secondary | ICD-10-CM

## 2015-01-15 ENCOUNTER — Ambulatory Visit (HOSPITAL_COMMUNITY)
Admission: RE | Admit: 2015-01-15 | Discharge: 2015-01-15 | Disposition: A | Discharge status: Home / Self Care | Payer: BC Managed Care – PPO | Source: Ambulatory Visit | Attending: Endocrinology | Admitting: Endocrinology

## 2015-01-15 DIAGNOSIS — E049 Nontoxic goiter, unspecified: Secondary | ICD-10-CM

## 2015-01-15 DIAGNOSIS — E041 Nontoxic single thyroid nodule: Secondary | ICD-10-CM | POA: Insufficient documentation

## 2015-02-21 ENCOUNTER — Other Ambulatory Visit: Payer: Self-pay | Admitting: Obstetrics and Gynecology

## 2015-02-21 DIAGNOSIS — Z803 Family history of malignant neoplasm of breast: Secondary | ICD-10-CM

## 2015-02-21 DIAGNOSIS — R921 Mammographic calcification found on diagnostic imaging of breast: Secondary | ICD-10-CM

## 2015-02-26 ENCOUNTER — Ambulatory Visit
Admission: RE | Admit: 2015-02-26 | Discharge: 2015-02-26 | Disposition: A | Discharge status: Home / Self Care | Payer: BC Managed Care – PPO | Source: Ambulatory Visit

## 2015-02-26 DIAGNOSIS — Z803 Family history of malignant neoplasm of breast: Secondary | ICD-10-CM

## 2015-02-26 DIAGNOSIS — R921 Mammographic calcification found on diagnostic imaging of breast: Secondary | ICD-10-CM

## 2015-03-12 ENCOUNTER — Other Ambulatory Visit (HOSPITAL_COMMUNITY): Payer: Self-pay | Admitting: Endocrinology

## 2015-03-12 DIAGNOSIS — M8580 Other specified disorders of bone density and structure, unspecified site: Secondary | ICD-10-CM

## 2015-07-07 ENCOUNTER — Ambulatory Visit (HOSPITAL_COMMUNITY)
Admission: RE | Admit: 2015-07-07 | Discharge: 2015-07-07 | Disposition: A | Discharge status: Home / Self Care | Payer: BC Managed Care – PPO | Source: Ambulatory Visit | Attending: Endocrinology | Admitting: Endocrinology

## 2015-07-07 DIAGNOSIS — M899 Disorder of bone, unspecified: Secondary | ICD-10-CM | POA: Insufficient documentation

## 2015-07-07 DIAGNOSIS — M8580 Other specified disorders of bone density and structure, unspecified site: Secondary | ICD-10-CM | POA: Diagnosis not present

## 2015-07-07 DIAGNOSIS — Z780 Asymptomatic menopausal state: Secondary | ICD-10-CM | POA: Diagnosis not present

## 2016-02-09 ENCOUNTER — Other Ambulatory Visit (HOSPITAL_COMMUNITY): Payer: Self-pay | Admitting: Family Medicine

## 2016-02-09 ENCOUNTER — Ambulatory Visit (HOSPITAL_COMMUNITY)
Admission: RE | Admit: 2016-02-09 | Discharge: 2016-02-09 | Disposition: A | Discharge status: Home / Self Care | Payer: BC Managed Care – PPO | Source: Ambulatory Visit | Attending: Family Medicine | Admitting: Family Medicine

## 2016-02-09 DIAGNOSIS — M25551 Pain in right hip: Secondary | ICD-10-CM

## 2016-02-09 DIAGNOSIS — W19XXXD Unspecified fall, subsequent encounter: Secondary | ICD-10-CM

## 2016-07-15 ENCOUNTER — Other Ambulatory Visit (INDEPENDENT_AMBULATORY_CARE_PROVIDER_SITE_OTHER): Payer: Self-pay | Admitting: *Deleted

## 2016-07-15 DIAGNOSIS — Z1211 Encounter for screening for malignant neoplasm of colon: Secondary | ICD-10-CM

## 2016-11-18 ENCOUNTER — Encounter (HOSPITAL_COMMUNITY): Payer: Self-pay

## 2016-11-18 ENCOUNTER — Ambulatory Visit (HOSPITAL_COMMUNITY): Admit: 2016-11-18 | Payer: BC Managed Care – PPO | Admitting: Internal Medicine

## 2016-11-18 SURGERY — COLONOSCOPY
Anesthesia: Moderate Sedation

## 2017-01-03 ENCOUNTER — Other Ambulatory Visit: Payer: Self-pay | Admitting: Orthopedic Surgery

## 2017-01-04 ENCOUNTER — Other Ambulatory Visit: Payer: Self-pay | Admitting: Orthopedic Surgery

## 2017-01-10 ENCOUNTER — Other Ambulatory Visit (INDEPENDENT_AMBULATORY_CARE_PROVIDER_SITE_OTHER): Payer: Self-pay | Admitting: *Deleted

## 2017-01-10 DIAGNOSIS — Z1211 Encounter for screening for malignant neoplasm of colon: Secondary | ICD-10-CM

## 2017-03-10 ENCOUNTER — Encounter (HOSPITAL_BASED_OUTPATIENT_CLINIC_OR_DEPARTMENT_OTHER): Payer: Self-pay | Admitting: *Deleted

## 2017-03-17 ENCOUNTER — Ambulatory Visit (HOSPITAL_BASED_OUTPATIENT_CLINIC_OR_DEPARTMENT_OTHER)
Admission: RE | Admit: 2017-03-17 | Discharge: 2017-03-17 | Disposition: A | Discharge status: Home / Self Care | Payer: BC Managed Care – PPO | Source: Ambulatory Visit | Attending: Orthopedic Surgery | Admitting: Orthopedic Surgery

## 2017-03-17 ENCOUNTER — Other Ambulatory Visit: Payer: Self-pay

## 2017-03-17 ENCOUNTER — Ambulatory Visit (HOSPITAL_BASED_OUTPATIENT_CLINIC_OR_DEPARTMENT_OTHER): Payer: BC Managed Care – PPO | Admitting: Anesthesiology

## 2017-03-17 ENCOUNTER — Encounter (HOSPITAL_BASED_OUTPATIENT_CLINIC_OR_DEPARTMENT_OTHER): Payer: Self-pay | Admitting: *Deleted

## 2017-03-17 ENCOUNTER — Encounter (HOSPITAL_BASED_OUTPATIENT_CLINIC_OR_DEPARTMENT_OTHER)
Admission: RE | Disposition: A | Discharge status: Home / Self Care | Payer: Self-pay | Source: Ambulatory Visit | Attending: Orthopedic Surgery

## 2017-03-17 DIAGNOSIS — Z91040 Latex allergy status: Secondary | ICD-10-CM | POA: Diagnosis not present

## 2017-03-17 DIAGNOSIS — M1812 Unilateral primary osteoarthritis of first carpometacarpal joint, left hand: Secondary | ICD-10-CM | POA: Insufficient documentation

## 2017-03-17 DIAGNOSIS — Z91013 Allergy to seafood: Secondary | ICD-10-CM | POA: Diagnosis not present

## 2017-03-17 DIAGNOSIS — Z9049 Acquired absence of other specified parts of digestive tract: Secondary | ICD-10-CM | POA: Insufficient documentation

## 2017-03-17 DIAGNOSIS — Z881 Allergy status to other antibiotic agents status: Secondary | ICD-10-CM | POA: Diagnosis not present

## 2017-03-17 DIAGNOSIS — Z886 Allergy status to analgesic agent status: Secondary | ICD-10-CM | POA: Diagnosis not present

## 2017-03-17 DIAGNOSIS — E079 Disorder of thyroid, unspecified: Secondary | ICD-10-CM | POA: Diagnosis not present

## 2017-03-17 DIAGNOSIS — M19042 Primary osteoarthritis, left hand: Secondary | ICD-10-CM | POA: Diagnosis present

## 2017-03-17 DIAGNOSIS — Z885 Allergy status to narcotic agent status: Secondary | ICD-10-CM | POA: Diagnosis not present

## 2017-03-17 HISTORY — PX: CARPOMETACARPEL SUSPENSION PLASTY: SHX5005

## 2017-03-17 SURGERY — CARPOMETACARPEL (CMC) SUSPENSION PLASTY
Anesthesia: Regional | Site: Hand | Laterality: Left

## 2017-03-17 MED ORDER — ROPIVACAINE HCL 5 MG/ML IJ SOLN
INTRAMUSCULAR | Status: DC | PRN
  Administered 2017-03-17: 30 mL via PERINEURAL

## 2017-03-17 MED ORDER — MIDAZOLAM HCL 2 MG/2ML IJ SOLN
1.0000 mg | INTRAMUSCULAR | Status: DC | PRN
  Administered 2017-03-17 (×2): 1 mg via INTRAVENOUS

## 2017-03-17 MED ORDER — FENTANYL CITRATE (PF) 100 MCG/2ML IJ SOLN
INTRAMUSCULAR | Status: AC
  Filled 2017-03-17: qty 2

## 2017-03-17 MED ORDER — FENTANYL CITRATE (PF) 100 MCG/2ML IJ SOLN
50.0000 ug | INTRAMUSCULAR | Status: DC | PRN
  Administered 2017-03-17: 50 ug via INTRAVENOUS

## 2017-03-17 MED ORDER — SCOPOLAMINE 1 MG/3DAYS TD PT72
MEDICATED_PATCH | TRANSDERMAL | Status: AC
  Filled 2017-03-17: qty 1

## 2017-03-17 MED ORDER — LACTATED RINGERS IV SOLN
INTRAVENOUS | Status: DC
  Administered 2017-03-17: 10 mL/h via INTRAVENOUS

## 2017-03-17 MED ORDER — PROPOFOL 10 MG/ML IV BOLUS
INTRAVENOUS | Status: DC | PRN
  Administered 2017-03-17: 20 mg via INTRAVENOUS

## 2017-03-17 MED ORDER — CEFAZOLIN SODIUM-DEXTROSE 2-4 GM/100ML-% IV SOLN
INTRAVENOUS | Status: AC
  Filled 2017-03-17: qty 100

## 2017-03-17 MED ORDER — PHENYLEPHRINE 40 MCG/ML (10ML) SYRINGE FOR IV PUSH (FOR BLOOD PRESSURE SUPPORT)
PREFILLED_SYRINGE | INTRAVENOUS | Status: AC
  Filled 2017-03-17: qty 10

## 2017-03-17 MED ORDER — SCOPOLAMINE 1 MG/3DAYS TD PT72: 1.0000 | MEDICATED_PATCH | Freq: Once | TRANSDERMAL | Status: DC | PRN

## 2017-03-17 MED ORDER — SUCCINYLCHOLINE CHLORIDE 200 MG/10ML IV SOSY
PREFILLED_SYRINGE | INTRAVENOUS | Status: AC
Start: 2017-03-17 — End: 2017-03-17
  Filled 2017-03-17: qty 10

## 2017-03-17 MED ORDER — EPHEDRINE 5 MG/ML INJ
INTRAVENOUS | Status: AC
  Filled 2017-03-17: qty 10

## 2017-03-17 MED ORDER — FENTANYL CITRATE (PF) 100 MCG/2ML IJ SOLN: 25.0000 ug | INTRAMUSCULAR | Status: DC | PRN

## 2017-03-17 MED ORDER — ONDANSETRON HCL 4 MG/2ML IJ SOLN
INTRAMUSCULAR | Status: AC
  Filled 2017-03-17: qty 2

## 2017-03-17 MED ORDER — CHLORHEXIDINE GLUCONATE 4 % EX LIQD: 60.0000 mL | Freq: Once | CUTANEOUS | Status: DC

## 2017-03-17 MED ORDER — LIDOCAINE 2% (20 MG/ML) 5 ML SYRINGE
INTRAMUSCULAR | Status: AC
  Filled 2017-03-17: qty 5

## 2017-03-17 MED ORDER — DEXAMETHASONE SODIUM PHOSPHATE 10 MG/ML IJ SOLN
INTRAMUSCULAR | Status: AC
  Filled 2017-03-17: qty 1

## 2017-03-17 MED ORDER — PROPOFOL 500 MG/50ML IV EMUL
INTRAVENOUS | Status: DC | PRN
  Administered 2017-03-17: 75 ug/kg/min via INTRAVENOUS

## 2017-03-17 MED ORDER — ONDANSETRON HCL 4 MG/2ML IJ SOLN
INTRAMUSCULAR | Status: DC | PRN
  Administered 2017-03-17: 4 mg via INTRAVENOUS

## 2017-03-17 MED ORDER — MIDAZOLAM HCL 2 MG/2ML IJ SOLN
INTRAMUSCULAR | Status: AC
  Filled 2017-03-17: qty 2

## 2017-03-17 MED ORDER — METOCLOPRAMIDE HCL 5 MG/ML IJ SOLN: 10.0000 mg | Freq: Once | INTRAMUSCULAR | Status: DC | PRN

## 2017-03-17 MED ORDER — TRAMADOL HCL 50 MG PO TABS: 50.0000 mg | ORAL_TABLET | Freq: Four times a day (QID) | ORAL | 0 refills | Status: DC | PRN

## 2017-03-17 MED ORDER — SCOPOLAMINE 1 MG/3DAYS TD PT72
1.0000 | MEDICATED_PATCH | TRANSDERMAL | Status: DC
  Administered 2017-03-17: 1.5 mg via TRANSDERMAL

## 2017-03-17 MED ORDER — CEFAZOLIN SODIUM-DEXTROSE 2-4 GM/100ML-% IV SOLN
2.0000 g | INTRAVENOUS | Status: AC
  Administered 2017-03-17: 2 g via INTRAVENOUS

## 2017-03-17 MED ORDER — LIDOCAINE 2% (20 MG/ML) 5 ML SYRINGE
INTRAMUSCULAR | Status: DC | PRN
  Administered 2017-03-17: 50 mg via INTRAVENOUS

## 2017-03-17 SURGICAL SUPPLY — 56 items
BIT DRILL 7/64X5 DISP (BIT) ×3 IMPLANT
BLADE MINI RND TIP GREEN BEAV (BLADE) ×3 IMPLANT
BLADE SURG 15 STRL LF DISP TIS (BLADE) ×1 IMPLANT
BLADE SURG 15 STRL SS (BLADE) ×2
BNDG COHESIVE 3X5 TAN STRL LF (GAUZE/BANDAGES/DRESSINGS) ×3 IMPLANT
BNDG ESMARK 4X9 LF (GAUZE/BANDAGES/DRESSINGS) ×3 IMPLANT
BNDG GAUZE ELAST 4 BULKY (GAUZE/BANDAGES/DRESSINGS) ×3 IMPLANT
CHLORAPREP W/TINT 26ML (MISCELLANEOUS) ×3 IMPLANT
CORD BIPOLAR FORCEPS 12FT (ELECTRODE) ×3 IMPLANT
COVER BACK TABLE 60X90IN (DRAPES) ×3 IMPLANT
COVER MAYO STAND STRL (DRAPES) ×3 IMPLANT
CUFF TOURNIQUET SINGLE 18IN (TOURNIQUET CUFF) ×3 IMPLANT
DECANTER SPIKE VIAL GLASS SM (MISCELLANEOUS) IMPLANT
DRAPE EXTREMITY T 121X128X90 (DRAPE) ×3 IMPLANT
DRAPE OEC MINIVIEW 54X84 (DRAPES) ×3 IMPLANT
DRAPE SURG 17X23 STRL (DRAPES) ×3 IMPLANT
GAUZE SPONGE 4X4 12PLY STRL (GAUZE/BANDAGES/DRESSINGS) ×3 IMPLANT
GAUZE SPONGE 4X4 16PLY XRAY LF (GAUZE/BANDAGES/DRESSINGS) IMPLANT
GAUZE XEROFORM 1X8 LF (GAUZE/BANDAGES/DRESSINGS) ×3 IMPLANT
GLOVE BIOGEL PI IND STRL 7.0 (GLOVE) ×2 IMPLANT
GLOVE BIOGEL PI IND STRL 8.5 (GLOVE) ×1 IMPLANT
GLOVE BIOGEL PI INDICATOR 7.0 (GLOVE) ×4
GLOVE BIOGEL PI INDICATOR 8.5 (GLOVE) ×2
GLOVE SS BIOGEL STRL SZ 7.5 (GLOVE) ×1 IMPLANT
GLOVE SUPERSENSE BIOGEL SZ 7.5 (GLOVE) ×2
GLOVE SURG ORTHO 8.0 STRL STRW (GLOVE) IMPLANT
GLOVE SURG SS PI 8.0 STRL IVOR (GLOVE) ×6 IMPLANT
GOWN STRL REUS W/ TWL LRG LVL3 (GOWN DISPOSABLE) ×1 IMPLANT
GOWN STRL REUS W/TWL LRG LVL3 (GOWN DISPOSABLE) ×2
GOWN STRL REUS W/TWL XL LVL3 (GOWN DISPOSABLE) ×6 IMPLANT
NEEDLE PRECISIONGLIDE 27X1.5 (NEEDLE) IMPLANT
NS IRRIG 1000ML POUR BTL (IV SOLUTION) ×3 IMPLANT
PACK BASIN DAY SURGERY FS (CUSTOM PROCEDURE TRAY) ×3 IMPLANT
PAD CAST 3X4 CTTN HI CHSV (CAST SUPPLIES) ×1 IMPLANT
PADDING CAST ABS 3INX4YD NS (CAST SUPPLIES)
PADDING CAST ABS COTTON 3X4 (CAST SUPPLIES) IMPLANT
PADDING CAST COTTON 3X4 STRL (CAST SUPPLIES) ×2
SLEEVE SCD COMPRESS KNEE MED (MISCELLANEOUS) ×3 IMPLANT
SLING ARM FOAM STRAP MED (SOFTGOODS) ×3 IMPLANT
SPLINT PLASTER CAST XFAST 3X15 (CAST SUPPLIES) ×10 IMPLANT
SPLINT PLASTER XTRA FASTSET 3X (CAST SUPPLIES) ×20
STOCKINETTE 4X48 STRL (DRAPES) ×3 IMPLANT
SUT ETHIBOND 3-0 V-5 (SUTURE) ×3 IMPLANT
SUT ETHILON 4 0 PS 2 18 (SUTURE) ×6 IMPLANT
SUT FIBERWIRE 4-0 18 DIAM BLUE (SUTURE) ×3
SUT MERSILENE 4 0 P 3 (SUTURE) IMPLANT
SUT STEEL 3 0 (SUTURE) ×3 IMPLANT
SUT VIC AB 4-0 P-3 18XBRD (SUTURE) IMPLANT
SUT VIC AB 4-0 P2 18 (SUTURE) IMPLANT
SUT VIC AB 4-0 P3 18 (SUTURE)
SUTURE FIBERWR 4-0 18 DIA BLUE (SUTURE) ×1 IMPLANT
SYR BULB 3OZ (MISCELLANEOUS) ×3 IMPLANT
SYR CONTROL 10ML LL (SYRINGE) IMPLANT
TOWEL OR 17X24 6PK STRL BLUE (TOWEL DISPOSABLE) ×6 IMPLANT
TOWEL OR NON WOVEN STRL DISP B (DISPOSABLE) IMPLANT
UNDERPAD 30X30 (UNDERPADS AND DIAPERS) ×3 IMPLANT

## 2017-03-17 NOTE — Progress Notes (Signed)
Assisted Dr. Foster with left, ultrasound guided, supraclavicular block. Side rails up, monitors on throughout procedure. See vital signs in flow sheet. Tolerated Procedure well. ?

## 2017-03-17 NOTE — Brief Op Note (Signed)
03/17/2017  10:11 AM  PATIENT:  Reyne DumasJane H Pettigrew  62 y.o. female  PRE-OPERATIVE DIAGNOSIS:  CARPOMETACARPAL PANTRAPEZIAL ARTHRITIS LEFT THUMB  POST-OPERATIVE DIAGNOSIS:  CARPOMETACARPAL PANTRAPEZIAL ARTHRITIS LEF  PROCEDURE:  Procedure(s) with comments: SUSPENSIONPLASTY LEFT THUMB PARTIAL TRAPEZOID EXCISION ABDUCTOR POLLICIS LONGUS TRANSFER TRAPAZIUM EXCISION (Left) - AXILLARY BLOCK  SURGEON:  Surgeon(s) and Role:    * Cindee SaltKuzma, Kirti Carl, MD - Primary  PHYSICIAN ASSISTANT:   ASSISTANTS: K Drenda Sobecki,MD   ANESTHESIA:   regional and IV sedation  EBL:  2 mL   BLOOD ADMINISTERED:none  DRAINS: none   LOCAL MEDICATIONS USED:  NONE  SPECIMEN:  No Specimen  DISPOSITION OF SPECIMEN:  N/A  COUNTS:  YES  TOURNIQUET:   Total Tourniquet Time Documented: Upper Arm (Left) - 61 minutes Total: Upper Arm (Left) - 61 minutes   DICTATION: .Other Dictation: Dictation Number [id number]  PLAN OF CARE: Discharge to home after PACU  PATIENT DISPOSITION:  PACU - hemodynamically stable.

## 2017-03-17 NOTE — Op Note (Signed)
I assisted Surgeon(s) and Role:    * Cindee SaltKuzma, Gary, MD - Primary on the Procedure(s): SUSPENSIONPLASTY LEFT THUMB PARTIAL TRAPEZOID EXCISION ABDUCTOR POLLICIS LONGUS TRANSFER TRAPAZIUM EXCISION on 03/17/2017.  I provided assistance on this case as follows: retraction soft tissues, harvest tendon, passing of tendon transfer, closure wounds.  Electronically signed by: Tami RibasKUZMA,Kamry Faraci R, MD Date: 03/17/2017 Time: 10:05 AM

## 2017-03-17 NOTE — Anesthesia Preprocedure Evaluation (Addendum)
Anesthesia Evaluation  Patient identified by MRN, date of birth, ID band Patient awake    Reviewed: Allergy & Precautions, NPO status , Patient's Chart, lab work & pertinent test results  Airway Mallampati: I  TM Distance: >3 FB Neck ROM: Full    Dental no notable dental hx. (+) Teeth Intact, Caps,  Veneers on upper teeth:   Pulmonary neg pulmonary ROS,    Pulmonary exam normal breath sounds clear to auscultation       Cardiovascular negative cardio ROS Normal cardiovascular exam Rhythm:Regular Rate:Normal     Neuro/Psych negative neurological ROS  negative psych ROS   GI/Hepatic negative GI ROS, Neg liver ROS,   Endo/Other  negative endocrine ROS  Renal/GU negative Renal ROS  negative genitourinary   Musculoskeletal  (+) Arthritis , Carpometacarpal pantrapezial arthritis left thumb   Abdominal (+) - obese,   Peds  Hematology negative hematology ROS (+)   Anesthesia Other Findings   Reproductive/Obstetrics                            Anesthesia Physical Anesthesia Plan  ASA: I  Anesthesia Plan: Regional   Post-op Pain Management:    Induction:   PONV Risk Score and Plan: 3 and Ondansetron, Dexamethasone, Midazolam and Treatment may vary due to age or medical condition  Airway Management Planned:   Additional Equipment:   Intra-op Plan:   Post-operative Plan:   Informed Consent: I have reviewed the patients History and Physical, chart, labs and discussed the procedure including the risks, benefits and alternatives for the proposed anesthesia with the patient or authorized representative who has indicated his/her understanding and acceptance.   Dental advisory given  Plan Discussed with: CRNA, Anesthesiologist and Surgeon  Anesthesia Plan Comments:        Anesthesia Quick Evaluation

## 2017-03-17 NOTE — Transfer of Care (Signed)
Immediate Anesthesia Transfer of Care Note  Patient: DEMIA VIERA  Procedure(s) Performed: SUSPENSIONPLASTY LEFT THUMB PARTIAL TRAPEZOID EXCISION ABDUCTOR POLLICIS LONGUS TRANSFER TRAPAZIUM EXCISION (Left Hand)  Patient Location: PACU  Anesthesia Type:MAC combined with regional for post-op pain  Level of Consciousness: awake, alert  and oriented  Airway & Oxygen Therapy: Patient Spontanous Breathing and Patient connected to face mask oxygen  Post-op Assessment: Report given to RN and Post -op Vital signs reviewed and stable  Post vital signs: Reviewed and stable  Last Vitals:  Vitals:   03/17/17 0815 03/17/17 1010  BP: 110/71 95/72  Pulse: 69 78  Resp: 11 (!) 21  Temp:    SpO2: 100% 97%    Last Pain:  Vitals:   03/17/17 0733  TempSrc: Bladder      Patients Stated Pain Goal: 1 (03/70/48 8891)  Complications: No apparent anesthesia complications

## 2017-03-17 NOTE — Op Note (Signed)
Other Dictation: Dictation Number [id number]

## 2017-03-17 NOTE — Anesthesia Procedure Notes (Addendum)
Anesthesia Regional Block: Supraclavicular block   Pre-Anesthetic Checklist: ,, timeout performed, Correct Patient, Correct Site, Correct Laterality, Correct Procedure, Correct Position, site marked, Risks and benefits discussed,  Surgical consent,  Pre-op evaluation,  At surgeon's request and post-op pain management  Laterality: Left  Prep: chloraprep       Needles:  Injection technique: Single-shot  Needle Type: Echogenic Stimulator Needle     Needle Length: 9cm  Needle Gauge: 21   Needle insertion depth: 5 cm   Additional Needles:   Procedures:,,,, ultrasound used (permanent image in chart),,,,  Narrative:  Start time: 03/17/2017 7:59 AM End time: 03/17/2017 8:04 AM Injection made incrementally with aspirations every 5 mL.  Performed by: Personally  Anesthesiologist: Mal AmabileFoster, Blaze Sandin, MD  Additional Notes: Timeout performed. Patient sedated. Relevant anatomy ID'd using US. Incremental 2-765ml injection of LA with frequent aspiration. Patient tolerated procedure well.        Left Supraclavicular Block

## 2017-03-17 NOTE — Anesthesia Postprocedure Evaluation (Signed)
Anesthesia Post Note  Patient: Reyne DumasJane H Husk  Procedure(s) Performed: SUSPENSIONPLASTY LEFT THUMB PARTIAL TRAPEZOID EXCISION ABDUCTOR POLLICIS LONGUS TRANSFER TRAPAZIUM EXCISION (Left Hand)     Patient location during evaluation: PACU Anesthesia Type: Regional Level of consciousness: awake and alert and oriented Pain management: pain level controlled Vital Signs Assessment: post-procedure vital signs reviewed and stable Respiratory status: spontaneous breathing, nonlabored ventilation and respiratory function stable Cardiovascular status: blood pressure returned to baseline and stable Postop Assessment: no apparent nausea or vomiting Anesthetic complications: no Comments: Good sensory and motor block left upper extremity    Last Vitals:  Vitals:   03/17/17 1030 03/17/17 1031  BP:  108/76  Pulse: 63 64  Resp: (!) 8 16  Temp:    SpO2: 97% 98%    Last Pain:  Vitals:   03/17/17 1031  TempSrc:   PainSc: 0-No pain                 Shahla Betsill A.

## 2017-03-17 NOTE — H&P (Signed)
Beth DumasJane H Lynch is an 62 y.o. female.   Chief Complaint:left hand pain ZOX:WRUEHPI:[name] is a 62 year old female  comes in with a complaint of pain at the carpometacarpal joint of her left thumb pain at the PIP joint of her right ring. This been going on for a year. It has increased over the past 4 months. She states her ring finger has occurred since Christmas. She recalls no new history of injury. She is wearing a splint on a relatively constant basis thumb spica nature to the left side. She complains of a burning pain with a VAS score of 7 over 10. Occasionally awakens her at night. She is recalls no history of injury to her neck although she did sustain an injury to her head when a window fell on it years ago. She has been taking Tylenol for this. She states that she cannot take topical nonsteroidal anti-inflammatories or p.o. nonsteroidal anti-inflammatories and that they cause hives for her. States nothing seems to make it better or worse. Use use increases this for her. She is complaining of occasional numbness and tingling in her fingers. This is bilateral. The has had injections to the [facility] joints of her thumbs in the past.She was sent for nerve conductions Dr. Riccardo DubinKarvelas to make certain she did not have carpal tunnel syndrome. These came back entirely normal.          Past Medical History:  Diagnosis Date  . Thyroid disease   . Wears glasses     Past Surgical History:  Procedure Laterality Date  . BACK SURGERY  2014   lumb fusion  . CHOLECYSTECTOMY    . COLONOSCOPY    . LIPOMA EXCISION     shoulder  . TONSILLECTOMY    . TRIGGER FINGER RELEASE Right 12/27/2013   Procedure: RELEASE TRIGGER FINGER/A-1 PULLEY RIGHT MIDDLE FINGER;  Surgeon: Cindee SaltGary Larayne Baxley, MD;  Location: [facility];  Service: Orthopedics;  Laterality: Right;  . TUBAL LIGATION      History reviewed. No pertinent family history. Social History:  reports that  has never smoked. she has never used smokeless tobacco.  She reports that she drinks alcohol. She reports that she does not use drugs.  Allergies:  Allergies  Allergen Reactions  . Shellfish Allergy Anaphylaxis  . Alendronate Sodium Hives  . Calcium-Containing Compounds Hives  . Codeine Hives  . Imitrex [Sumatriptan]     Breast lump  . Latex Hives  . Nsaids Hives  . Betadine [Povidone Iodine] Rash  . Hydrocodone Hives, Itching and Rash  . Oxycodone Hives, Itching and Rash  . Tetracycline Rash    No medications prior to admission.    No results found for this or any previous visit (from the past 48 hour(s)).  No results found.   Pertinent items are noted in HPI.  Height 5\' 6"  (1.676 m), weight 58.5 kg (129 lb).  General appearance: alert, cooperative and appears stated age Head: Normocephalic, without obvious abnormality Neck: no JVD Resp: clear to auscultation bilaterally Cardio: regular rate and rhythm, S1, S2 normal, no murmur, click, rub or gallop GI: soft, non-tender; bowel sounds normal; no masses,  no organomegaly Extremities: left hand pain Pulses: 2+ and symmetric Skin: Skin color, texture, turgor normal. No rashes or lesions Neurologic: Grossly normal Incision/Wound: na  Assessment/Plan Assessment:  1. Primary osteoarthritis of both first carpometacarpal joints    Plan: She states that she is tired of the discomfort and would like to have this corrected. We have discussed suspension  plasty with probable partial excision of the  trapezoid or distal pole of the scaphoid. Trapezial excision is discussed with her. She is aware that there is no guarantee to the surgery the possibility of infection recurrence injury to arteries nerves tendons complete relief symptoms and dystrophy. Like to proceed. Questions are encouraged and answered. She is scheduled for still excision with APL transfer probable partial excision trapezoid as an outpatient under regional anesthesia.      Sylvestre Rathgeber R 03/17/2017, 5:35 AM

## 2017-03-17 NOTE — Discharge Instructions (Addendum)
[facility] Center Instructions Hand Surgery  Wound Care: Keep your hand elevated above the level of your heart.  Do not allow it to dangle by your side.  Keep the dressing dry and do not remove it unless your doctor advises you to do so.  He will usually change it at the time of your post-op visit.  Moving your fingers is advised to stimulate circulation but will depend on the site of your surgery.  If you have a splint applied, your doctor will advise you regarding movement.  Activity: Do not drive or operate machinery today.  Rest today and then you may return to your normal activity and work as indicated by your physician.  Diet:  Drink liquids today or eat a light diet.  You may resume a regular diet tomorrow.      Post Anesthesia Home Care Instructions  Activity: Get plenty of rest for the remainder of the day. A responsible individual must stay with you for 24 hours following the procedure.  For the next 24 hours, DO NOT: -Drive a car -Advertising copywriterperate machinery -Drink alcoholic beverages -Take any medication unless instructed by your physician -Make any legal decisions or sign important papers.  Meals: Start with liquid foods such as gelatin or soup. Progress to regular foods as tolerated. Avoid greasy, spicy, heavy foods. If nausea and/or vomiting occur, drink only clear liquids until the nausea and/or vomiting subsides. Call your physician if vomiting continues.  Special Instructions/Symptoms: Your throat may feel dry or sore from the anesthesia or the breathing tube placed in your throat during surgery. If this causes discomfort, gargle with warm salt water. The discomfort should disappear within 24 hours.  If you had a scopolamine patch placed behind your ear for the management of post- operative nausea and/or vomiting:  1. The medication in the patch is effective for 72 hours, after which it should be removed.  Wrap patch in a tissue and discard in the trash. Wash hands thoroughly  with soap and water. 2. You may remove the patch earlier than 72 hours if you experience unpleasant side effects which may include dry mouth, dizziness or visual disturbances. 3. Avoid touching the patch. Wash your hands with soap and water after contact with the patch.   Regional Anesthesia Blocks  1. Numbness or the inability to move the "blocked" extremity may last from 3-48 hours after placement. The length of time depends on the medication injected and your individual response to the medication. If the numbness is not going away after 48 hours, call your surgeon.  2. The extremity that is blocked will need to be protected until the numbness is gone and the  Strength has returned. Because you cannot feel it, you will need to take extra care to avoid injury. Because it may be weak, you may have difficulty moving it or using it. You may not know what position it is in without looking at it while the block is in effect.  3. For blocks in the legs and feet, returning to weight bearing and walking needs to be done carefully. You will need to wait until the numbness is entirely gone and the strength has returned. You should be able to move your leg and foot normally before you try and bear weight or walk. You will need someone to be with you when you first try to ensure you do not fall and possibly risk injury.  4. Bruising and tenderness at the needle site are common side effects and  will resolve in a few days.  5. Persistent numbness or new problems with movement should be communicated to the surgeon or the [facility] [phone] [facility] [phone]). General expectations: Pain for two to three days. Fingers may become slightly swollen.  Call your doctor if any of the following occur: Severe pain not relieved by pain medication. Elevated temperature. Dressing soaked with blood. Inability to move fingers. White or bluish color to fingers.   Post  Anesthesia Home Care Instructions  Activity: Get plenty of rest for the remainder of the day. A responsible individual must stay with you for 24 hours following the procedure.  For the next 24 hours, DO NOT: -Drive a car -Advertising copywriter -Drink alcoholic beverages -Take any medication unless instructed by your physician -Make any legal decisions or sign important papers.  Meals: Start with liquid foods such as gelatin or soup. Progress to regular foods as tolerated. Avoid greasy, spicy, heavy foods. If nausea and/or vomiting occur, drink only clear liquids until the nausea and/or vomiting subsides. Call your physician if vomiting continues.  Special Instructions/Symptoms: Your throat may feel dry or sore from the anesthesia or the breathing tube placed in your throat during surgery. If this causes discomfort, gargle with warm salt water. The discomfort should disappear within 24 hours.  If you had a scopolamine patch placed behind your ear for the management of post- operative nausea and/or vomiting:  1. The medication in the patch is effective for 72 hours, after which it should be removed.  Wrap patch in a tissue and discard in the trash. Wash hands thoroughly with soap and water. 2. You may remove the patch earlier than 72 hours if you experience unpleasant side effects which may include dry mouth, dizziness or visual disturbances. 3. Avoid touching the patch. Wash your hands with soap and water after contact with the patch.    Regional Anesthesia Blocks  1. Numbness or the inability to move the "blocked" extremity may last from 3-48 hours after placement. The length of time depends on the medication injected and your individual response to the medication. If the numbness is not going away after 48 hours, call your surgeon.  2. The extremity that is blocked will need to be protected until the numbness is gone and the  Strength has returned. Because you cannot feel it, you will need  to take extra care to avoid injury. Because it may be weak, you may have difficulty moving it or using it. You may not know what position it is in without looking at it while the block is in effect.  3. For blocks in the legs and feet, returning to weight bearing and walking needs to be done carefully. You will need to wait until the numbness is entirely gone and the strength has returned. You should be able to move your leg and foot normally before you try and bear weight or walk. You will need someone to be with you when you first try to ensure you do not fall and possibly risk injury.  4. Bruising and tenderness at the needle site are common side effects and will resolve in a few days.  5. Persistent numbness or new problems with movement should be communicated to the surgeon or the [facility] [phone] [facility] [phone]).

## 2017-03-18 ENCOUNTER — Encounter (HOSPITAL_BASED_OUTPATIENT_CLINIC_OR_DEPARTMENT_OTHER): Payer: Self-pay | Admitting: Orthopedic Surgery

## 2017-03-18 NOTE — Op Note (Signed)
NAMDemaris Callander:  HARRELL, Brizza                ACCOUNT NO.:  [id number]  MEDICAL RECORD NO.:  [id number]  LOCATION:                                 FACILITY:  PHYSICIAN:  Cindee SaltGary Angelia Hazell, M.D.            DATE OF BIRTH:  DATE OF PROCEDURE:  03/17/2017 DATE OF DISCHARGE:                              OPERATIVE REPORT   PREOPERATIVE DIAGNOSIS:  Pantrapezial arthritis, left thumb.  POSTOPERATIVE DIAGNOSIS:  Pantrapezial arthritis, left thumb.  OPERATION:  Excision of trapezium, partial excision trapezoid with suspensionplasty, APL transfer, left thumb.  SURGEON:  Cindee SaltGary Khan Chura, MD.  ASSISTANT:  Betha LoaKevin Renell Allum, MD.  ANESTHESIA:  Supraclavicular block with IV sedation.  [facility]  ANESTHESIOLOGIST:  [place].  HISTORY:  The patient is a 62 year old female with a history of CMC arthritis.  This has not responded to conservative treatment or prolonged period of time.  She has elected to undergo surgical intervention [facility].  Pre, peri, and postoperative courses have been discussed along with risks and complications.  She is aware that there is no guarantee to the surgery, the possibility of infection, recurrence of injury to arteries, nerves, and tendons, incomplete relief of symptoms, and dystrophy.  In the preoperative area, the patient is seen, the extremity marked by both patient and surgeon, antibiotic given.  DESCRIPTION OF PROCEDURE:  The patient was brought to the operating room where a IV sedation was carried out.  A supraclavicular block had been carried out in the preoperative area.  She was prepped and draped using ChloraPrep in supine position with the left arm free.  A 3-minute dry time was allowed and time-out was taken confirming the patient and procedure.  The limb was exsanguinated with an Esmarch bandage. Tourniquet placed high on the arm was inflated to 250 mmHg.  A hockey- stick incision was made over the base of the metacarpal of the  thumb, carried along the volar aspect to the first dorsal extensor compartment. Bleeders were electrocauterized with bipolar.  Dorsal and sensory nerves were identified and protected.  The interval between the abductor pollicis longus and extensor pollicis brevis was then opened.  The [facility] joint was immediately apparent.  This was markedly arthritic.  The dissection was carried proximally.  The radial artery was identified proximally.  The STT joint was opened.  The periosteum was then elevated off from the trapezium over to the trapezoid.  The trapezium was then attempted to be removed in toto, but fragmented easily being relatively soft bone.  The trapezium was excised in toto.  The proximal aspect of the trapezoid, which showed significant degenerative changes was then partially resected using rongeurs and osteotomes.  The most dorsal aspect of the abductor pollicis longus tendon was then isolated.  This was placed under traction.  A separate incision was made proximally over the radial muscles on the dorsal radial forearm.  Carried down through subcutaneous tissue.  The muscle was identified at the musculotendinous junction.  A Carroll tendon retriever was then placed through the first dorsal extensor compartment.  A monofilament wire was then used as a cheese cutter, brought from  a distal to proximal direction and the most dorsal portion of the abductor pollicis longus was transected at the musculotendinous junction.  His wound was irrigated and closed with interrupted 4-0 nylon sutures.  The tendon was delivered distally. Drill holes were then placed in the base of the thumb metacarpal in a dorsal radial to ulnar volar direction.  This exited through the articular surface ulnarly.  A separate drill hole was then made in the base of the index metacarpal in a volar radial to dorsal ulnar position bringing it out in the interossei space between the index and middle metacarpals.  An  incision was made over this area, carried down to the hole.  The adductor pollicis longus tendon left attached to the base of the thumb metacarpal was then passed through the hole in the base of the thumb metacarpal from the dorsal radial to ulnar volar position and then through the index metacarpal in a volar radial to ulnar dorsal position. A hemostat was then used to dissect along the base of the index metacarpal to the wound dorsally.  This was done beneath the tendons and the abductor pollicis longus was then brought back into the cavity of the trapezium.  The wound over the interspace between the index middle metacarpal was then closed with interrupted 4-0 nylon sutures.  The adductor pollicis longus was then wrapped around the exit from the thumb to index metacarpal and sutured into position with multiple 3-0 Ethibond sutures.  The remainder of the tendon was then passed into the interval between the distal scaphoid and trapezoid.  The wound was irrigated.  X- rays revealed that the thumb was well suspended from the index finger. The capsule was closed with figure-of-eight 4-0 Vicryl sutures, the subcutaneous tissue with interrupted 4-0 Vicryl, and the skin with interrupted 4-0 nylon sutures.  A sterile compressive dressing and dorsal palmar thumb spica splint was applied.  On deflation of the tourniquet, all fingers immediately pinked.  She was taken to the recovery room for observation in satisfactory condition.  She will be discharged to home, to return to the [facility] in 1 week, on tramadol.          ______________________________ Cindee Salt, M.D.     GK/MEDQ  D:  03/17/2017  T:  03/17/2017  Job:  [id number]

## 2017-03-31 ENCOUNTER — Telehealth (INDEPENDENT_AMBULATORY_CARE_PROVIDER_SITE_OTHER): Payer: Self-pay | Admitting: *Deleted

## 2017-03-31 ENCOUNTER — Encounter (INDEPENDENT_AMBULATORY_CARE_PROVIDER_SITE_OTHER): Payer: Self-pay | Admitting: *Deleted

## 2017-03-31 MED ORDER — PEG 3350-KCL-NA BICARB-NACL 420 G PO SOLR: 4000.0000 mL | Freq: Once | ORAL | 0 refills | Status: AC

## 2017-03-31 NOTE — Telephone Encounter (Signed)
Referring MD/PCP: golding   Procedure: tcs  Reason/Indication:  screening  Has patient had this procedure before?  Yes, over 10 yrs ago  If so, when, by whom and where?    Is there a family history of colon cancer?  no  Who?  What age when diagnosed?    Is patient diabetic?   no      Does patient have prosthetic heart valve or mechanical valve?  no  Do you have a pacemaker?  no  Has patient ever had endocarditis?  no  Has patient had joint replacement within last 12 months?  no  Is patient constipated or do they take laxatives? no  Does patient have a history of alcohol/drug use?  no  Is patient on blood thinner such as Coumadin, Plavix and/or Aspirin? no  Medications: restasis eye drops, vit d3 1000 iu daily, zyrtec, calcium  Allergies: see epic  Medication Adjustment per Dr Keane Policeehman/Terri Setzer, NP:   Procedure date & time: 04/28/17 at 730

## 2017-03-31 NOTE — Telephone Encounter (Signed)
agree

## 2017-03-31 NOTE — Telephone Encounter (Signed)
Patient needs trilyte 

## 2017-04-28 ENCOUNTER — Encounter (HOSPITAL_COMMUNITY): Payer: Self-pay | Admitting: *Deleted

## 2017-04-28 ENCOUNTER — Ambulatory Visit (HOSPITAL_COMMUNITY)
Admission: RE | Admit: 2017-04-28 | Discharge: 2017-04-28 | Disposition: A | Discharge status: Home / Self Care | Payer: BC Managed Care – PPO | Source: Ambulatory Visit | Attending: Internal Medicine | Admitting: Internal Medicine

## 2017-04-28 ENCOUNTER — Other Ambulatory Visit: Payer: Self-pay

## 2017-04-28 ENCOUNTER — Encounter (HOSPITAL_COMMUNITY)
Admission: RE | Disposition: A | Discharge status: Home / Self Care | Payer: Self-pay | Source: Ambulatory Visit | Attending: Internal Medicine

## 2017-04-28 DIAGNOSIS — K5790 Diverticulosis of intestine, part unspecified, without perforation or abscess without bleeding: Secondary | ICD-10-CM | POA: Diagnosis not present

## 2017-04-28 DIAGNOSIS — Z79899 Other long term (current) drug therapy: Secondary | ICD-10-CM | POA: Diagnosis not present

## 2017-04-28 DIAGNOSIS — Z1211 Encounter for screening for malignant neoplasm of colon: Secondary | ICD-10-CM | POA: Diagnosis not present

## 2017-04-28 DIAGNOSIS — Z9049 Acquired absence of other specified parts of digestive tract: Secondary | ICD-10-CM | POA: Diagnosis not present

## 2017-04-28 DIAGNOSIS — Z79891 Long term (current) use of opiate analgesic: Secondary | ICD-10-CM | POA: Diagnosis not present

## 2017-04-28 DIAGNOSIS — K644 Residual hemorrhoidal skin tags: Secondary | ICD-10-CM | POA: Diagnosis not present

## 2017-04-28 DIAGNOSIS — K5730 Diverticulosis of large intestine without perforation or abscess without bleeding: Secondary | ICD-10-CM | POA: Diagnosis not present

## 2017-04-28 HISTORY — PX: COLONOSCOPY: SHX5424

## 2017-04-28 HISTORY — DX: Nontoxic multinodular goiter: E04.2

## 2017-04-28 SURGERY — COLONOSCOPY
Anesthesia: Moderate Sedation

## 2017-04-28 MED ORDER — NALOXONE HCL 0.4 MG/ML IJ SOLN
INTRAMUSCULAR | Status: AC
  Filled 2017-04-28: qty 1

## 2017-04-28 MED ORDER — MEPERIDINE HCL 50 MG/ML IJ SOLN
INTRAMUSCULAR | Status: AC
  Filled 2017-04-28: qty 1

## 2017-04-28 MED ORDER — MEPERIDINE HCL 50 MG/ML IJ SOLN
INTRAMUSCULAR | Status: DC | PRN
  Administered 2017-04-28 (×2): 25 mg

## 2017-04-28 MED ORDER — EPINEPHRINE PF 1 MG/10ML IJ SOSY
PREFILLED_SYRINGE | INTRAMUSCULAR | Status: AC
  Filled 2017-04-28: qty 10

## 2017-04-28 MED ORDER — ATROPINE SULFATE 1 MG/ML IJ SOLN
INTRAMUSCULAR | Status: AC
  Filled 2017-04-28: qty 1

## 2017-04-28 MED ORDER — SODIUM CHLORIDE 0.9 % IV SOLN
INTRAVENOUS | Status: DC
  Administered 2017-04-28: 07:00:00 via INTRAVENOUS

## 2017-04-28 MED ORDER — FLUMAZENIL 0.5 MG/5ML IV SOLN
INTRAVENOUS | Status: AC
  Filled 2017-04-28: qty 5

## 2017-04-28 MED ORDER — MIDAZOLAM HCL 5 MG/5ML IJ SOLN
INTRAMUSCULAR | Status: DC | PRN
  Administered 2017-04-28 (×2): 2 mg via INTRAVENOUS
  Administered 2017-04-28: 1 mg via INTRAVENOUS

## 2017-04-28 MED ORDER — MIDAZOLAM HCL 5 MG/5ML IJ SOLN
INTRAMUSCULAR | Status: AC
  Filled 2017-04-28: qty 10

## 2017-04-28 NOTE — Discharge Instructions (Signed)
Resume usual medications as before. High-fiber diet. No driving for 24 hours. Next screening exam in 10 years.   Colonoscopy, Adult, Care After This sheet gives you information about how to care for yourself after your procedure. Your health care provider may also give you more specific instructions. If you have problems or questions, contact your health care provider.  Dr Karilyn Cotaehman:  [phone]:  [phone].  After hours and weekends call the hospital and have the GI doctor on call paged.  They will call you back.  What can I expect after the procedure? After the procedure, it is common to have:  A small amount of blood in your stool for 24 hours after the procedure.  Some gas.  Mild abdominal cramping or bloating.  Follow these instructions at home: General instructions   For the first 24 hours after the procedure: ? Do not drive or use machinery. ? Do not sign important documents. ? Do not drink alcohol. ? Rest often.  Take over-the-counter or prescription medicines only as told by your health care provide Relieving cramping and bloating  Try walking around when you have cramps or feel bloated.  Eating and drinking  Drink enough fluid to keep your urine clear or pale yellow.  Resume your normal diet as instructed by your health care provider.  Avoid drinking alcohol for 24 hours.   Contact a health care provider if:  You have blood in your stool 2-3 days after the procedure. Get help right away if:  You have more than a small spotting of blood in your stool.  You pass large blood clots in your stool.  Your abdomen is swollen.  You have nausea or vomiting.  You have a fever.  You have increasing abdominal pain that is not relieved with medicine. This information is not intended to replace advice given to you by your health care provider. Make sure you discuss any questions you have with your health care provider. Document Released: 09/23/2003 Document Revised:  11/03/2015 Document Reviewed: 04/22/2015 Elsevier Interactive Patient Education  [employer]2018 [employer]

## 2017-04-28 NOTE — Op Note (Signed)
[facility] Patient Name: Beth Lynch Procedure Date: 04/28/2017 7:14 AM MRN: [id number] Date of Birth: 1955-09-18 Attending MD: Lionel December , MD CSN: [id number] Age: 62 Admit Type: Outpatient Procedure:                Colonoscopy Indications:              Screening for colorectal malignant neoplasm Providers:                Lionel December, MD, Lizabeth Leyden, RN, Jannett Celestine,                            RN, Burke Keels, Technician Referring MD:             Corrie Mckusick, MD Medicines:                Meperidine 50 mg IV, Midazolam 5 mg IV Complications:            No immediate complications. Estimated Blood Loss:     Estimated blood loss: none. Procedure:                Pre-Anesthesia Assessment:                           - Prior to the procedure, a History and Physical                            was performed, and patient medications and                            allergies were reviewed. The patient's tolerance of                            previous anesthesia was also reviewed. The risks                            and benefits of the procedure and the sedation                            options and risks were discussed with the patient.                            All questions were answered, and informed consent                            was obtained. Prior Anticoagulants: The patient has                            taken no previous anticoagulant or antiplatelet                            agents. ASA Grade Assessment: I - A normal, healthy                            patient. After reviewing the risks and benefits,  the patient was deemed in satisfactory condition to                            undergo the procedure.                           After obtaining informed consent, the colonoscope                            was passed under direct vision. Throughout the                            procedure, the patient's blood pressure, pulse, and                     oxygen saturations were monitored continuously. The                            [id number] ([id number]) was introduced through the                            anus and advanced to the the cecum, identified by                            appendiceal orifice and ileocecal valve. The                            colonoscopy was performed without difficulty. The                            patient tolerated the procedure well. The quality                            of the bowel preparation was excellent. The                            ileocecal valve, appendiceal orifice, and rectum                            were photographed. Scope In: 7:37:00 AM Scope Out: 7:55:24 AM Scope Withdrawal Time: 0 hours 5 minutes 57 seconds  Total Procedure Duration: 0 hours 18 minutes 24 seconds  Findings:      The perianal and digital rectal examinations were normal.      A few medium-mouthed diverticula were found in the sigmoid colon.      External hemorrhoids were found during retroflexion. The hemorrhoids       were small.      The exam was otherwise normal throughout the examined colon. Impression:               - Diverticulosis in the sigmoid colon.                           - External hemorrhoids.                           - No specimens  collected. Moderate Sedation:      Moderate (conscious) sedation was administered by the endoscopy nurse       and supervised by the endoscopist. The following parameters were       monitored: oxygen saturation, heart rate, blood pressure, CO2       capnography and response to care. Total physician intraservice time was       23 minutes. Recommendation:           - Patient has a contact number available for                            emergencies. The signs and symptoms of potential                            delayed complications were discussed with the                            patient. Return to normal activities tomorrow.                             Written discharge instructions were provided to the                            patient.                           - High fiber diet today.                           - Continue present medications.                           - Repeat colonoscopy in 10 years for screening                            purposes. Procedure Code(s):        --- Professional ---                           [id number], Colonoscopy, flexible; diagnostic, including                            collection of specimen(s) by brushing or washing,                            when performed (separate procedure)                           99152, Moderate sedation services provided by the                            same physician or other qualified health care                            professional performing the diagnostic or  therapeutic service that the sedation supports,                            requiring the presence of an independent trained                            observer to assist in the monitoring of the                            patient's level of consciousness and physiological                            status; initial 15 minutes of intraservice time,                            patient age 29 years or older                           [phone], Moderate sedation services; each additional                            15 minutes intraservice time Diagnosis Code(s):        --- Professional ---                           Z12.11, Encounter for screening for malignant                            neoplasm of colon                           K64.4, Residual hemorrhoidal skin tags                           K57.30, Diverticulosis of large intestine without                            perforation or abscess without bleeding CPT copyright 2016 American [employer] Association. All rights reserved. The codes documented in this report are preliminary and upon coder review may  be revised to meet current compliance  requirements. Lionel December, MD Lionel December, MD 04/28/2017 8:07:07 AM This report has been signed electronically. Number of Addenda: 0

## 2017-04-28 NOTE — H&P (Signed)
Beth DumasJane H Brahmbhatt is an 62 y.o. female.   Chief Complaint: Patient is here for colonoscopy. HPI: Patient is 62 year old Caucasian female who is here for screening colonoscopy.  Last exam was normal about 10 years ago.  She denies abdominal pain change in bowel habits or rectal bleeding. Family history is negative for CRC.  Past Medical History:  Diagnosis Date  . Multiple thyroid nodules   . Thyroid disease   . Wears glasses     Past Surgical History:  Procedure Laterality Date  . BACK SURGERY  2014   lumb fusion  . CARPOMETACARPEL SUSPENSION PLASTY Left 03/17/2017   Procedure: SUSPENSIONPLASTY LEFT THUMB PARTIAL TRAPEZOID EXCISION ABDUCTOR POLLICIS LONGUS TRANSFER TRAPAZIUM EXCISION;  Surgeon: Cindee SaltKuzma, Gary, MD;  Location: [facility];  Service: Orthopedics;  Laterality: Left;  AXILLARY BLOCK  . CHOLECYSTECTOMY    . COLONOSCOPY    . LIPOMA EXCISION     shoulder  . TONSILLECTOMY    . TRIGGER FINGER RELEASE Right 12/27/2013   Procedure: RELEASE TRIGGER FINGER/A-1 PULLEY RIGHT MIDDLE FINGER;  Surgeon: Cindee SaltGary Kuzma, MD;  Location: [facility];  Service: Orthopedics;  Laterality: Right;  . TUBAL LIGATION      Family History  Problem Relation Age of Onset  . Colon cancer Neg Hx    Social History:  reports that  has never smoked. she has never used smokeless tobacco. She reports that she drinks alcohol. She reports that she does not use drugs.  Allergies:  Allergies  Allergen Reactions  . Shellfish Allergy Anaphylaxis  . Alendronate Sodium Hives  . Calcium-Containing Compounds Hives  . Codeine Hives  . Imitrex [Sumatriptan] Other (See Comments)    Breast lump  . Latex Hives  . Nsaids Hives  . Betadine [Povidone Iodine] Rash  . Hydrocodone Hives, Itching and Rash  . Oxycodone Hives, Itching and Rash  . Tetracycline Rash    Medications Prior to Admission  Medication Sig Dispense Refill  . acetaminophen (TYLENOL) 500 MG tablet Take 1,000 mg by mouth  every 6 (six) hours as needed.    . cholecalciferol (VITAMIN D) 1000 units tablet Take 1,000 Units by mouth at bedtime.     . cycloSPORINE (RESTASIS) 0.05 % ophthalmic emulsion Place 1 drop into both eyes 2 (two) times daily.    Marland Kitchen. levocetirizine (XYZAL) 5 MG tablet Take 5 mg by mouth at bedtime.     . traMADol (ULTRAM) 50 MG tablet Take 1 tablet (50 mg total) by mouth every 6 (six) hours as needed. (Patient taking differently: Take 50 mg by mouth every 6 (six) hours as needed (for pain.). ) 20 tablet 0  . Flaxseed, Linseed, (FLAXSEED OIL) 1000 MG CAPS Take 1,000 mg by mouth at bedtime.      No results found for this or any previous visit (from the past 48 hour(s)). No results found.  ROS  Blood pressure 123/82, pulse 67, temperature 97.6 F (36.4 C), temperature source Oral, resp. rate 11, height 5\' 6"  (1.676 m), weight 127 lb (57.6 kg), SpO2 97 %. Physical Exam  Constitutional: She appears well-developed and well-nourished.  HENT:  Mouth/Throat: Oropharynx is clear and moist.  Eyes: Conjunctivae are normal. No scleral icterus.  Neck: No thyromegaly present.  Cardiovascular: Normal rate, regular rhythm and normal heart sounds.  No murmur heard. Respiratory: Effort normal and breath sounds normal.  GI: Soft. She exhibits no distension and no mass. There is no tenderness.  Musculoskeletal: She exhibits no edema.  Lymphadenopathy:  She has no cervical adenopathy.  Neurological: She is alert.  Skin: Skin is warm and dry.     Assessment/Plan Average risk screening colonoscopy.  Lionel December, MD 04/28/2017, 7:29 AM

## 2017-05-03 ENCOUNTER — Encounter (HOSPITAL_COMMUNITY): Payer: Self-pay | Admitting: Internal Medicine

## 2017-07-14 ENCOUNTER — Other Ambulatory Visit (HOSPITAL_COMMUNITY): Payer: Self-pay | Admitting: Endocrinology

## 2017-07-14 DIAGNOSIS — E049 Nontoxic goiter, unspecified: Secondary | ICD-10-CM

## 2017-07-22 ENCOUNTER — Ambulatory Visit (HOSPITAL_COMMUNITY)
Admission: RE | Admit: 2017-07-22 | Discharge: 2017-07-22 | Disposition: A | Discharge status: Home / Self Care | Payer: BC Managed Care – PPO | Source: Ambulatory Visit | Attending: Endocrinology | Admitting: Endocrinology

## 2017-07-22 DIAGNOSIS — E049 Nontoxic goiter, unspecified: Secondary | ICD-10-CM

## 2017-07-22 DIAGNOSIS — E041 Nontoxic single thyroid nodule: Secondary | ICD-10-CM | POA: Insufficient documentation

## 2017-12-30 ENCOUNTER — Other Ambulatory Visit: Payer: Self-pay | Admitting: Orthopedic Surgery

## 2018-02-07 ENCOUNTER — Encounter (HOSPITAL_BASED_OUTPATIENT_CLINIC_OR_DEPARTMENT_OTHER): Payer: Self-pay | Admitting: *Deleted

## 2018-02-07 ENCOUNTER — Other Ambulatory Visit: Payer: Self-pay

## 2018-02-17 ENCOUNTER — Ambulatory Visit (HOSPITAL_BASED_OUTPATIENT_CLINIC_OR_DEPARTMENT_OTHER): Payer: BC Managed Care – PPO | Admitting: Certified Registered"

## 2018-02-17 ENCOUNTER — Other Ambulatory Visit: Payer: Self-pay

## 2018-02-17 ENCOUNTER — Encounter (HOSPITAL_BASED_OUTPATIENT_CLINIC_OR_DEPARTMENT_OTHER)
Admission: RE | Disposition: A | Discharge status: Home / Self Care | Payer: Self-pay | Source: Home / Self Care | Attending: Orthopedic Surgery

## 2018-02-17 ENCOUNTER — Encounter (HOSPITAL_BASED_OUTPATIENT_CLINIC_OR_DEPARTMENT_OTHER): Payer: Self-pay | Admitting: Certified Registered"

## 2018-02-17 ENCOUNTER — Ambulatory Visit (HOSPITAL_BASED_OUTPATIENT_CLINIC_OR_DEPARTMENT_OTHER)
Admission: RE | Admit: 2018-02-17 | Discharge: 2018-02-17 | Disposition: A | Discharge status: Home / Self Care | Payer: BC Managed Care – PPO | Attending: Orthopedic Surgery | Admitting: Orthopedic Surgery

## 2018-02-17 DIAGNOSIS — Z79899 Other long term (current) drug therapy: Secondary | ICD-10-CM | POA: Diagnosis not present

## 2018-02-17 DIAGNOSIS — E079 Disorder of thyroid, unspecified: Secondary | ICD-10-CM | POA: Diagnosis not present

## 2018-02-17 DIAGNOSIS — M65332 Trigger finger, left middle finger: Secondary | ICD-10-CM | POA: Insufficient documentation

## 2018-02-17 HISTORY — PX: TRIGGER FINGER RELEASE: SHX641

## 2018-02-17 SURGERY — RELEASE, A1 PULLEY, FOR TRIGGER FINGER
Anesthesia: Regional | Site: Finger | Laterality: Left

## 2018-02-17 MED ORDER — BUPIVACAINE HCL (PF) 0.25 % IJ SOLN
INTRAMUSCULAR | Status: DC | PRN
  Administered 2018-02-17: 7 mL

## 2018-02-17 MED ORDER — LACTATED RINGERS IV SOLN
INTRAVENOUS | Status: DC
  Administered 2018-02-17 (×2): via INTRAVENOUS

## 2018-02-17 MED ORDER — CEFAZOLIN SODIUM-DEXTROSE 2-4 GM/100ML-% IV SOLN
INTRAVENOUS | Status: AC
  Filled 2018-02-17: qty 100

## 2018-02-17 MED ORDER — ONDANSETRON HCL 4 MG/2ML IJ SOLN: 4.0000 mg | Freq: Once | INTRAMUSCULAR | Status: DC | PRN

## 2018-02-17 MED ORDER — FENTANYL CITRATE (PF) 100 MCG/2ML IJ SOLN
50.0000 ug | INTRAMUSCULAR | Status: DC | PRN
  Administered 2018-02-17: 100 ug via INTRAVENOUS

## 2018-02-17 MED ORDER — PROPOFOL 500 MG/50ML IV EMUL
INTRAVENOUS | Status: DC | PRN
  Administered 2018-02-17: 75 ug/kg/min via INTRAVENOUS

## 2018-02-17 MED ORDER — TRAMADOL HCL 50 MG PO TABS: 50.0000 mg | ORAL_TABLET | Freq: Four times a day (QID) | ORAL | 0 refills | Status: DC | PRN

## 2018-02-17 MED ORDER — SCOPOLAMINE 1 MG/3DAYS TD PT72: 1.0000 | MEDICATED_PATCH | Freq: Once | TRANSDERMAL | Status: DC | PRN

## 2018-02-17 MED ORDER — MIDAZOLAM HCL 2 MG/2ML IJ SOLN
1.0000 mg | INTRAMUSCULAR | Status: DC | PRN
  Administered 2018-02-17: 2 mg via INTRAVENOUS

## 2018-02-17 MED ORDER — CHLORHEXIDINE GLUCONATE 4 % EX LIQD: 60.0000 mL | Freq: Once | CUTANEOUS | Status: DC

## 2018-02-17 MED ORDER — LIDOCAINE HCL (CARDIAC) PF 100 MG/5ML IV SOSY
PREFILLED_SYRINGE | INTRAVENOUS | Status: DC | PRN
  Administered 2018-02-17: 30 mg via INTRAVENOUS

## 2018-02-17 MED ORDER — FENTANYL CITRATE (PF) 100 MCG/2ML IJ SOLN
INTRAMUSCULAR | Status: AC
  Filled 2018-02-17: qty 2

## 2018-02-17 MED ORDER — ONDANSETRON HCL 4 MG/2ML IJ SOLN
INTRAMUSCULAR | Status: DC | PRN
  Administered 2018-02-17: 4 mg via INTRAVENOUS

## 2018-02-17 MED ORDER — MIDAZOLAM HCL 2 MG/2ML IJ SOLN
INTRAMUSCULAR | Status: AC
  Filled 2018-02-17: qty 2

## 2018-02-17 MED ORDER — CEFAZOLIN SODIUM-DEXTROSE 2-4 GM/100ML-% IV SOLN
2.0000 g | INTRAVENOUS | Status: AC
  Administered 2018-02-17: 2 g via INTRAVENOUS

## 2018-02-17 SURGICAL SUPPLY — 33 items
BANDAGE COBAN STERILE 2 (GAUZE/BANDAGES/DRESSINGS) ×3 IMPLANT
BLADE SURG 15 STRL LF DISP TIS (BLADE) ×1 IMPLANT
BLADE SURG 15 STRL SS (BLADE) ×2
BNDG ESMARK 4X9 LF (GAUZE/BANDAGES/DRESSINGS) IMPLANT
CHLORAPREP W/TINT 26ML (MISCELLANEOUS) ×3 IMPLANT
CORD BIPOLAR FORCEPS 12FT (ELECTRODE) IMPLANT
COVER BACK TABLE 60X90IN (DRAPES) ×3 IMPLANT
COVER MAYO STAND STRL (DRAPES) ×3 IMPLANT
COVER WAND RF STERILE (DRAPES) IMPLANT
CUFF TOURNIQUET SINGLE 18IN (TOURNIQUET CUFF) IMPLANT
DECANTER SPIKE VIAL GLASS SM (MISCELLANEOUS) IMPLANT
DRAPE EXTREMITY T 121X128X90 (DRAPE) ×3 IMPLANT
DRAPE SURG 17X23 STRL (DRAPES) ×3 IMPLANT
GAUZE SPONGE 4X4 12PLY STRL (GAUZE/BANDAGES/DRESSINGS) ×3 IMPLANT
GAUZE XEROFORM 1X8 LF (GAUZE/BANDAGES/DRESSINGS) ×3 IMPLANT
GLOVE BIOGEL PI IND STRL 8 (GLOVE) ×1 IMPLANT
GLOVE BIOGEL PI IND STRL 8.5 (GLOVE) ×1 IMPLANT
GLOVE BIOGEL PI INDICATOR 8 (GLOVE) ×2
GLOVE BIOGEL PI INDICATOR 8.5 (GLOVE) ×2
GLOVE SURG ORTHO 8.0 STRL STRW (GLOVE) ×3 IMPLANT
GLOVE SURG SYN 8.0 (GLOVE) ×3 IMPLANT
GOWN STRL REUS W/ TWL LRG LVL3 (GOWN DISPOSABLE) ×1 IMPLANT
GOWN STRL REUS W/TWL LRG LVL3 (GOWN DISPOSABLE) ×2
GOWN STRL REUS W/TWL XL LVL3 (GOWN DISPOSABLE) ×3 IMPLANT
NEEDLE PRECISIONGLIDE 27X1.5 (NEEDLE) ×3 IMPLANT
NS IRRIG 1000ML POUR BTL (IV SOLUTION) ×3 IMPLANT
PACK BASIN DAY SURGERY FS (CUSTOM PROCEDURE TRAY) ×3 IMPLANT
STOCKINETTE 4X48 STRL (DRAPES) ×3 IMPLANT
SUT ETHILON 4 0 PS 2 18 (SUTURE) ×3 IMPLANT
SYR BULB 3OZ (MISCELLANEOUS) ×3 IMPLANT
SYR CONTROL 10ML LL (SYRINGE) ×3 IMPLANT
TOWEL GREEN STERILE FF (TOWEL DISPOSABLE) ×6 IMPLANT
UNDERPAD 30X30 (UNDERPADS AND DIAPERS) ×3 IMPLANT

## 2018-02-17 NOTE — Brief Op Note (Signed)
02/17/2018  1:58 PM  PATIENT:  Reyne DumasJane H Kluesner  62 y.o. female  PRE-OPERATIVE DIAGNOSIS:  LEFT MIDDLE TRIGGER FINGER  POST-OPERATIVE DIAGNOSIS:  LEFT MIDDLE TRIGGER FINGER  PROCEDURE:  Procedure(s): RELEASE TRIGGER FINGER/A-1 PULLEY LEFT MIDDLE FINGER (Left)  SURGEON:  Surgeon(s) and Role:    * Cindee SaltKuzma, Robbie Rideaux, MD - Primary  PHYSICIAN ASSISTANT:   ASSISTANTS: none   ANESTHESIA:   local, regional and IV sedation  EBL:  1ml   BLOOD ADMINISTERED:none  DRAINS: none   LOCAL MEDICATIONS USED:  BUPIVICAINE   SPECIMEN:  No Specimen  DISPOSITION OF SPECIMEN:  N/A  COUNTS:  YES  TOURNIQUET:   Total Tourniquet Time Documented: Forearm (Left) - 21 minutes Total: Forearm (Left) - 21 minutes   DICTATION: .Reubin Milanragon Dictation  PLAN OF CARE: Discharge to home after PACU  PATIENT DISPOSITION:  PACU - hemodynamically stable.

## 2018-02-17 NOTE — Anesthesia Preprocedure Evaluation (Signed)
Anesthesia Evaluation  Patient identified by MRN, date of birth, ID band Patient awake    Reviewed: Allergy & Precautions, NPO status , Patient's Chart, lab work & pertinent test results  History of Anesthesia Complications Negative for: history of anesthetic complications  Airway Mallampati: I  TM Distance: >3 FB Neck ROM: Full    Dental no notable dental hx.    Pulmonary neg pulmonary ROS,    Pulmonary exam normal        Cardiovascular negative cardio ROS Normal cardiovascular exam     Neuro/Psych negative neurological ROS  negative psych ROS   GI/Hepatic negative GI ROS, Neg liver ROS,   Endo/Other  negative endocrine ROS  Renal/GU negative Renal ROS  negative genitourinary   Musculoskeletal negative musculoskeletal ROS (+)   Abdominal   Peds  Hematology negative hematology ROS (+)   Anesthesia Other Findings   Reproductive/Obstetrics                             Anesthesia Physical Anesthesia Plan  ASA: I  Anesthesia Plan: Bier Block and Bier Block-LIDOCAINE ONLY   Post-op Pain Management:    Induction:   PONV Risk Score and Plan: 2 and Propofol infusion, TIVA and Treatment may vary due to age or medical condition  Airway Management Planned: Nasal Cannula and Simple Face Mask  Additional Equipment: None  Intra-op Plan:   Post-operative Plan:   Informed Consent: I have reviewed the patients History and Physical, chart, labs and discussed the procedure including the risks, benefits and alternatives for the proposed anesthesia with the patient or authorized representative who has indicated his/her understanding and acceptance.     Plan Discussed with:   Anesthesia Plan Comments:         Anesthesia Quick Evaluation

## 2018-02-17 NOTE — H&P (Signed)
Beth DumasJane H Lynch is an 62 y.o. female.   Chief Complaint: catching left middle finger HPI: Beth SquibbJane is a 62 yo female post-op  suspension plasty triggering her left thumb left middle finger.  She had an injection to the thumb and middle finger. The thumb has resolved. The middle finger continues to catch for her. Is moderately painful with use. She is complaining of slight collapse of her thumb with a hyperextension deformity of approximately 20 degrees of the metacarpal phalangeal joint with pinching. States that this feels associated it is decreasing her strength. No new injuries. She is not complaining of pain at the carpometacarpal joint of the thumb.  The left middle finger A1 pulley is injected for second time with Celestone Xylocaine.    Past Medical History:  Diagnosis Date  . Multiple thyroid nodules   . Thyroid disease   . Wears glasses     Past Surgical History:  Procedure Laterality Date  . BACK SURGERY  2014   lumb fusion  . CARPOMETACARPEL SUSPENSION PLASTY Left 03/17/2017   Procedure: SUSPENSIONPLASTY LEFT THUMB PARTIAL TRAPEZOID EXCISION ABDUCTOR POLLICIS LONGUS TRANSFER TRAPAZIUM EXCISION;  Surgeon: Beth Lynch, Beth Witherington, MD;  Location: [facility];  Service: Orthopedics;  Laterality: Left;  AXILLARY BLOCK  . CHOLECYSTECTOMY    . COLONOSCOPY    . COLONOSCOPY N/A 04/28/2017   Procedure: COLONOSCOPY;  Surgeon: Beth Lynch, Beth U, MD;  [facility];  Service: Endoscopy;  Laterality: N/A;  730  . LIPOMA EXCISION     shoulder  . TONSILLECTOMY    . TRIGGER FINGER RELEASE Right 12/27/2013   Procedure: RELEASE TRIGGER FINGER/A-1 PULLEY RIGHT MIDDLE FINGER;  Surgeon: Beth SaltGary Blythe Hartshorn, MD;  Location: [facility];  Service: Orthopedics;  Laterality: Right;  . TUBAL LIGATION      Family History  Problem Relation Age of Onset  . Colon cancer Neg Hx    Social History:  reports that she has never smoked. She has never used smokeless tobacco. She reports current  alcohol use. She reports that she does not use drugs.  Allergies:  Allergies  Allergen Reactions  . Shellfish Allergy Anaphylaxis  . Alendronate Sodium Hives  . Calcium-Containing Compounds Hives    No reaction to LR in the past per pt 02/07/18  . Codeine Hives  . Imitrex [Sumatriptan] Other (See Comments)    Breast lump  . Latex Hives  . Nsaids Hives  . Betadine [Povidone Iodine] Rash  . Hydrocodone Hives, Itching and Rash  . Oxycodone Hives, Itching and Rash  . Tetracycline Rash    No medications prior to admission.    No results found for this or any previous visit (from the past 48 hour(s)).  No results found.   Pertinent items are noted in HPI.  Height 5\' 6"  (1.676 m), weight 56.2 kg.  General appearance: alert, cooperative and appears stated age Head: Normocephalic, without obvious abnormality Neck: no JVD Resp: clear to auscultation bilaterally Cardio: regular rate and rhythm, S1, S2 normal, no murmur, click, rub or gallop GI: soft, non-tender; bowel sounds normal; no masses,  no organomegaly Extremities:  catching left middle finger Pulses: 2+ and symmetric Skin: Skin color, texture, turgor normal. No rashes or lesions Neurologic: Grossly normal Incision/Wound: na  Assessment/Plan Diagnosis stenosing tenosynovitis left middle finger with status post suspension plasty left thumb  Plan: Release A-1 pulley left middle finger.Pre, peri, and postoperative course have been discussed along with risks and complications.  She is aware that there is no guarantee  with the surgery, the possibility of infection, recurrence of injury to arteries, nerves and tendons, and incomplete relief of symptoms.   Beth SaltGary Cordie Lynch 02/17/2018, 6:58 AM

## 2018-02-17 NOTE — Op Note (Signed)
NAME: Beth Lynch MEDICAL RECORD NO: [id number] DATE OF BIRTH: 03/27/1955 [facility]  [facility] PHYSICIAN: Nicki ReaperGARY R. Armella Stogner, MD   OPERATIVE REPORT   DATE OF PROCEDURE: 02/17/18    PREOPERATIVE DIAGNOSIS:   Trigger finger left middle finger   POSTOPERATIVE DIAGNOSIS:   Same   PROCEDURE:   Release A1 pulley left middle finger   SURGEON: Cindee SaltGary Keishawn Rajewski, M.D.   ASSISTANT: none   ANESTHESIA:  Regional with sedation and Local   INTRAVENOUS FLUIDS:  Per anesthesia flow sheet.   ESTIMATED BLOOD LOSS:  Minimal.   COMPLICATIONS:  None.   SPECIMENS:  none   TOURNIQUET TIME:    Total Tourniquet Time Documented: Forearm (Left) - 21 minutes Total: Forearm (Left) - 21 minutes    DISPOSITION:  Stable to PACU.   INDICATIONS: Patient is a 62 year old female with a history of trigger of her left middle finger.  She does have a Dupuytren's nodule but no cord.  Not responded to conservative treatment including multiple injections.  He is desirous having the A1 pulley release.  Pre-peri-and postoperative course been discussed along with risks and complications.  She is aware there is no guarantee to the surgery the possibility of infection recurrence injury to arteries nerves tendons complete relief of symptoms and dystrophy.  Preoperative area the patient is seen the extremity marked by both patient and surgeon antibiotic given  OPERATIVE COURSE: Patient is brought to the operating room where form based IV regional anesthetic was carried out without difficulty.  She was prepped using ChloraPrep in the supine position with a left arm free.  A three-minute dry time was allowed and timeout taken to confirm patient procedure.  After adequate anesthesia was afforded.  A oblique incision was made over the A1 pulley of the left middle finger carried down through subcutaneous tissue.  Bleeders were electrocauterized bipolar as necessary.  A1 pulley was identified.  The nodule  had no extensions to the A2 A1 pulley.  As such it was not removed.  The A1 pulley was released on its radial aspect a small incision was made centrally and A2.  The finger placed through full range of motion no further trigger was noted.  The 2 tendons were separated breaking any adhesions between the tenosynovial tissue which was moderately thickened.  Was copiously irrigated with saline.  The skin was closed interrupted 4-0 nylon sutures.  Infiltration with quarter percent bupivacaine without epinephrine was given approximately 7 cc was used.  A sterile compressive dressing with fingers 3 was applied.  On deflation of the tourniquet all fingers immediately pink.  She was taken to the recovery room for observation in satisfactory condition.  She will be discharged home to return Beth Lynch one 1 week on Ultram.   Cindee SaltGary Yanelie Abraha, MD Electronically signed, 02/17/18

## 2018-02-17 NOTE — Anesthesia Procedure Notes (Signed)
Procedure Name: MAC Date/Time: 02/17/2018 1:37 PM Performed by: Signe Colt, CRNA Pre-anesthesia Checklist: Patient identified, Emergency Drugs available, Suction available, Patient being monitored and Timeout performed Patient Re-evaluated:Patient Re-evaluated prior to induction Oxygen Delivery Method: Simple face mask

## 2018-02-17 NOTE — Transfer of Care (Signed)
Immediate Anesthesia Transfer of Care Note  Patient: Beth DumasJane H Boley  Procedure(s) Performed: RELEASE TRIGGER FINGER/A-1 PULLEY LEFT MIDDLE FINGER (Left Finger)  Patient Location: PACU  Anesthesia Type:Bier block  Level of Consciousness: awake, alert , oriented and patient cooperative  Airway & Oxygen Therapy: Patient Spontanous Breathing  Post-op Assessment: Report given to RN and Post -op Vital signs reviewed and stable  Post vital signs: Reviewed and stable  Last Vitals:  Vitals Value Taken Time  BP    Temp    Pulse    Resp    SpO2      Last Pain:  Vitals:   02/17/18 1203  TempSrc: Oral  PainSc: 0-No pain         Complications: No apparent anesthesia complications

## 2018-02-17 NOTE — Anesthesia Postprocedure Evaluation (Signed)
Anesthesia Post Note  Patient: Beth Lynch  Procedure(s) Performed: RELEASE TRIGGER FINGER/A-1 PULLEY LEFT MIDDLE FINGER (Left Finger)     Patient location during evaluation: PACU Anesthesia Type: Bier Block Level of consciousness: awake and alert Pain management: pain level controlled Vital Signs Assessment: post-procedure vital signs reviewed and stable Respiratory status: spontaneous breathing, nonlabored ventilation and respiratory function stable Cardiovascular status: blood pressure returned to baseline and stable Postop Assessment: no apparent nausea or vomiting Anesthetic complications: no    Last Vitals:  Vitals:   02/17/18 1400 02/17/18 1415  BP: 107/73 123/81  Pulse: 80   Resp: (!) 8   Temp:    SpO2: 100%     Last Pain:  Vitals:   02/17/18 1415  TempSrc:   PainSc: 0-No pain                 Lucretia Kernarolyn E Witman

## 2018-02-17 NOTE — Anesthesia Procedure Notes (Signed)
Anesthesia Regional Block: Bier block (IV Regional)   Pre-Anesthetic Checklist: ,, timeout performed, Correct Patient, Correct Site, Correct Laterality, Correct Procedure,, site marked, surgical consent,, at surgeon's request  Laterality: Left     Needles:  Injection technique: Single-shot  Needle Type: Other      Needle Gauge: 20     Additional Needles:   Procedures:,,,,, intact distal pulses, Esmarch exsanguination, single tourniquet utilized,  Narrative:   Performed by: Personally       

## 2018-02-17 NOTE — Discharge Instructions (Addendum)

## 2018-02-20 ENCOUNTER — Encounter (HOSPITAL_BASED_OUTPATIENT_CLINIC_OR_DEPARTMENT_OTHER): Payer: Self-pay | Admitting: Orthopedic Surgery

## 2018-08-18 ENCOUNTER — Other Ambulatory Visit: Payer: Self-pay | Admitting: Obstetrics & Gynecology

## 2018-08-18 DIAGNOSIS — N6310 Unspecified lump in the right breast, unspecified quadrant: Secondary | ICD-10-CM

## 2018-08-24 ENCOUNTER — Ambulatory Visit
Admission: RE | Admit: 2018-08-24 | Discharge: 2018-08-24 | Disposition: A | Discharge status: Home / Self Care | Payer: BC Managed Care – PPO | Source: Ambulatory Visit | Attending: Obstetrics & Gynecology | Admitting: Obstetrics & Gynecology

## 2018-08-24 ENCOUNTER — Other Ambulatory Visit: Payer: Self-pay | Admitting: Obstetrics & Gynecology

## 2018-08-24 ENCOUNTER — Other Ambulatory Visit: Payer: Self-pay

## 2018-08-24 DIAGNOSIS — N6320 Unspecified lump in the left breast, unspecified quadrant: Secondary | ICD-10-CM

## 2018-08-24 DIAGNOSIS — N6310 Unspecified lump in the right breast, unspecified quadrant: Secondary | ICD-10-CM

## 2018-08-29 ENCOUNTER — Other Ambulatory Visit: Payer: BC Managed Care – PPO

## 2018-09-08 ENCOUNTER — Other Ambulatory Visit: Payer: Self-pay | Admitting: *Deleted

## 2018-09-08 DIAGNOSIS — Z20822 Contact with and (suspected) exposure to covid-19: Secondary | ICD-10-CM

## 2018-09-14 LAB — NOVEL CORONAVIRUS, NAA: SARS-CoV-2, NAA: NOT DETECTED

## 2018-09-18 ENCOUNTER — Telehealth: Payer: Self-pay | Admitting: Family Medicine

## 2018-09-18 NOTE — Telephone Encounter (Signed)
Patient called in and got her covid results

## 2018-09-27 ENCOUNTER — Other Ambulatory Visit: Payer: Self-pay

## 2018-09-27 ENCOUNTER — Other Ambulatory Visit (HOSPITAL_COMMUNITY): Payer: Self-pay | Admitting: Family Medicine

## 2018-09-27 ENCOUNTER — Ambulatory Visit (HOSPITAL_COMMUNITY)
Admission: RE | Admit: 2018-09-27 | Discharge: 2018-09-27 | Disposition: A | Discharge status: Home / Self Care | Payer: BC Managed Care – PPO | Source: Ambulatory Visit | Attending: Family Medicine | Admitting: Family Medicine

## 2018-09-27 DIAGNOSIS — M79672 Pain in left foot: Secondary | ICD-10-CM

## 2018-11-03 ENCOUNTER — Other Ambulatory Visit: Payer: Self-pay | Admitting: *Deleted

## 2018-11-03 DIAGNOSIS — Z20822 Contact with and (suspected) exposure to covid-19: Secondary | ICD-10-CM

## 2018-11-04 LAB — NOVEL CORONAVIRUS, NAA: SARS-CoV-2, NAA: NOT DETECTED

## 2018-11-08 ENCOUNTER — Encounter: Payer: Self-pay | Admitting: Orthopaedic Surgery

## 2018-11-08 ENCOUNTER — Ambulatory Visit (INDEPENDENT_AMBULATORY_CARE_PROVIDER_SITE_OTHER): Payer: BC Managed Care – PPO | Admitting: Orthopaedic Surgery

## 2018-11-08 DIAGNOSIS — M2142 Flat foot [pes planus] (acquired), left foot: Secondary | ICD-10-CM

## 2018-11-08 DIAGNOSIS — M79672 Pain in left foot: Secondary | ICD-10-CM

## 2018-11-08 DIAGNOSIS — M76822 Posterior tibial tendinitis, left leg: Secondary | ICD-10-CM | POA: Diagnosis not present

## 2018-11-08 MED ORDER — METHYLPREDNISOLONE 4 MG PO TABS: ORAL_TABLET | ORAL | 0 refills | Status: DC

## 2018-11-08 NOTE — Progress Notes (Signed)
Office Visit Note   Patient: Beth DumasJane H Lynch           Date of Birth: 07/31/1955           MRN: [id number] Visit Date: 11/08/2018              Requested by: Assunta FoundGolding, John, MD [address] [place],  [place] [id number] PCP: Assunta FoundGolding, John, MD   Assessment & Plan: Visit Diagnoses:  1. Posterior tibial tendinitis, left leg   2. Acquired pes planovalgus of left foot     Plan: Discussed shoewear with patient today.  Have her apply Voltaren gel to the left foot over the first tarsal metatarsal joint.  We will send her to physical therapy for modalities to the posterior tibial tendon, home exercise program and strengthening..  Also place her on a Medrol Dosepak.  Follow-Up Instructions: Return if symptoms worsen or fail to improve.   Orders:  No orders of the defined types were placed in this encounter.  No orders of the defined types were placed in this encounter.     Procedures: No procedures performed   Clinical Data: No additional findings.   Subjective: Chief Complaint  Patient presents with  . Left Foot - Pain    HPI Mrs. Beth DustmanHarrill is a pleasant 63 year old female comes in today with left foot pain since July.  She is on a beach trip and did a lot of stairs and a lot of walking.  She also likes to walk for exercise.  She is been having severe pain in the foot.  She had radiographs that are on the [facility] coned system performed in early August I reviewed these images showed no acute fractures and no significant bony abnormalities she does have first tarsal metatarsal joint narrowing.  Also a Morton's type foot. She did get orthotics for shoes and get a new pair shoes she states this took some getting used to but did bother her back if she has some back issues. Review of Systems No fevers chills shortness breath chest pain  Objective: Vital Signs: There were no vitals taken for this visit.  Physical Exam Constitutional:      Appearance: She is not ill-appearing or  diaphoretic.  Cardiovascular:     Pulses: Normal pulses.  Pulmonary:     Effort: Pulmonary effort is normal.  Neurological:     Mental Status: She is alert and oriented to person, place, and time.     Ortho Exam Bilateral feet she is able to do a single heel raise bilaterally but has difficulty on the left.  He has pain in the posterior tibial region with a single heel raise on the left.  Inversion eversion against resistance out of 5 bilaterally.  Too many toes sign on the left negative on the right.  Acquired pes planus valgus deformities.  Tenderness over the left first tarsometatarsal joint with palpation.  Tenderness over the insertion of the left posterior tibial tendon near the navicular.  Otherwise bilateral feet nontender throughout. Specialty Comments:  No specialty comments available.  Imaging: No results found.   PMFS History: Patient Active Problem List   Diagnosis Date Noted  . Special screening for malignant neoplasms, colon 07/15/2016  . FOOT PAIN, RIGHT 06/28/2007   Past Medical History:  Diagnosis Date  . Multiple thyroid nodules   . Thyroid disease   . Wears glasses     Family History  Problem Relation Age of Onset  . Breast cancer Mother   .  Breast cancer Maternal Aunt   . Breast cancer Maternal Grandmother   . Colon cancer Neg Hx     Past Surgical History:  Procedure Laterality Date  . BACK SURGERY  2014   lumb fusion  . CARPOMETACARPEL SUSPENSION PLASTY Left 03/17/2017   Procedure: SUSPENSIONPLASTY LEFT THUMB PARTIAL TRAPEZOID EXCISION ABDUCTOR POLLICIS LONGUS TRANSFER TRAPAZIUM EXCISION;  Surgeon: Daryll Brod, MD;  Location: [facility];  Service: Orthopedics;  Laterality: Left;  AXILLARY BLOCK  . CHOLECYSTECTOMY    . COLONOSCOPY    . COLONOSCOPY N/A 04/28/2017   Procedure: COLONOSCOPY;  Surgeon: Rogene Houston, MD;  [facility];  Service: Endoscopy;  Laterality: N/A;  730  . LIPOMA EXCISION     shoulder  .  TONSILLECTOMY    . TRIGGER FINGER RELEASE Right 12/27/2013   Procedure: RELEASE TRIGGER FINGER/A-1 PULLEY RIGHT MIDDLE FINGER;  Surgeon: Daryll Brod, MD;  Location: [facility];  Service: Orthopedics;  Laterality: Right;  . TRIGGER FINGER RELEASE Left 02/17/2018   Procedure: RELEASE TRIGGER FINGER/A-1 PULLEY LEFT MIDDLE FINGER;  Surgeon: Daryll Brod, MD;  Location: [facility];  Service: Orthopedics;  Laterality: Left;  . TUBAL LIGATION     Social History   Occupational History  . Not on file  Tobacco Use  . Smoking status: Never Smoker  . Smokeless tobacco: Never Used  Substance and Sexual Activity  . Alcohol use: Yes    Comment: rare  . Drug use: No  . Sexual activity: Never

## 2018-12-11 ENCOUNTER — Other Ambulatory Visit: Payer: Self-pay | Admitting: Orthopedic Surgery

## 2018-12-21 ENCOUNTER — Encounter (HOSPITAL_BASED_OUTPATIENT_CLINIC_OR_DEPARTMENT_OTHER): Payer: Self-pay | Admitting: *Deleted

## 2018-12-21 ENCOUNTER — Other Ambulatory Visit: Payer: Self-pay

## 2018-12-22 NOTE — Progress Notes (Signed)

## 2018-12-25 ENCOUNTER — Other Ambulatory Visit: Payer: Self-pay

## 2018-12-25 ENCOUNTER — Other Ambulatory Visit (HOSPITAL_COMMUNITY)
Admission: RE | Admit: 2018-12-25 | Discharge: 2018-12-25 | Disposition: A | Discharge status: Home / Self Care | Payer: BC Managed Care – PPO | Source: Ambulatory Visit | Attending: Hematology & Oncology | Admitting: Hematology & Oncology

## 2018-12-25 ENCOUNTER — Inpatient Hospital Stay (HOSPITAL_COMMUNITY): Admission: RE | Admit: 2018-12-25 | Payer: BC Managed Care – PPO | Source: Ambulatory Visit

## 2018-12-25 ENCOUNTER — Other Ambulatory Visit (HOSPITAL_COMMUNITY): Payer: BC Managed Care – PPO

## 2018-12-25 DIAGNOSIS — Z01812 Encounter for preprocedural laboratory examination: Secondary | ICD-10-CM | POA: Diagnosis not present

## 2018-12-25 DIAGNOSIS — Z20828 Contact with and (suspected) exposure to other viral communicable diseases: Secondary | ICD-10-CM | POA: Insufficient documentation

## 2018-12-25 LAB — SARS CORONAVIRUS 2 (TAT 6-24 HRS): SARS Coronavirus 2: NEGATIVE

## 2018-12-28 ENCOUNTER — Encounter (HOSPITAL_BASED_OUTPATIENT_CLINIC_OR_DEPARTMENT_OTHER)
Admission: RE | Disposition: A | Discharge status: Home / Self Care | Payer: Self-pay | Source: Home / Self Care | Attending: Orthopedic Surgery

## 2018-12-28 ENCOUNTER — Ambulatory Visit (HOSPITAL_BASED_OUTPATIENT_CLINIC_OR_DEPARTMENT_OTHER): Payer: BC Managed Care – PPO | Admitting: Anesthesiology

## 2018-12-28 ENCOUNTER — Other Ambulatory Visit: Payer: Self-pay

## 2018-12-28 ENCOUNTER — Encounter (HOSPITAL_BASED_OUTPATIENT_CLINIC_OR_DEPARTMENT_OTHER): Payer: Self-pay | Admitting: Emergency Medicine

## 2018-12-28 ENCOUNTER — Ambulatory Visit (HOSPITAL_BASED_OUTPATIENT_CLINIC_OR_DEPARTMENT_OTHER)
Admission: RE | Admit: 2018-12-28 | Discharge: 2018-12-28 | Disposition: A | Discharge status: Home / Self Care | Payer: BC Managed Care – PPO | Attending: Orthopedic Surgery | Admitting: Orthopedic Surgery

## 2018-12-28 DIAGNOSIS — Z803 Family history of malignant neoplasm of breast: Secondary | ICD-10-CM | POA: Insufficient documentation

## 2018-12-28 DIAGNOSIS — Z888 Allergy status to other drugs, medicaments and biological substances status: Secondary | ICD-10-CM | POA: Insufficient documentation

## 2018-12-28 DIAGNOSIS — E042 Nontoxic multinodular goiter: Secondary | ICD-10-CM | POA: Insufficient documentation

## 2018-12-28 DIAGNOSIS — Z885 Allergy status to narcotic agent status: Secondary | ICD-10-CM | POA: Diagnosis not present

## 2018-12-28 DIAGNOSIS — M19042 Primary osteoarthritis, left hand: Secondary | ICD-10-CM | POA: Insufficient documentation

## 2018-12-28 DIAGNOSIS — Z9049 Acquired absence of other specified parts of digestive tract: Secondary | ICD-10-CM | POA: Insufficient documentation

## 2018-12-28 DIAGNOSIS — Z91040 Latex allergy status: Secondary | ICD-10-CM | POA: Diagnosis not present

## 2018-12-28 DIAGNOSIS — Z881 Allergy status to other antibiotic agents status: Secondary | ICD-10-CM | POA: Insufficient documentation

## 2018-12-28 DIAGNOSIS — E079 Disorder of thyroid, unspecified: Secondary | ICD-10-CM | POA: Insufficient documentation

## 2018-12-28 DIAGNOSIS — Z886 Allergy status to analgesic agent status: Secondary | ICD-10-CM | POA: Insufficient documentation

## 2018-12-28 DIAGNOSIS — Z79899 Other long term (current) drug therapy: Secondary | ICD-10-CM | POA: Insufficient documentation

## 2018-12-28 DIAGNOSIS — Z91013 Allergy to seafood: Secondary | ICD-10-CM | POA: Diagnosis not present

## 2018-12-28 DIAGNOSIS — M25342 Other instability, left hand: Secondary | ICD-10-CM | POA: Insufficient documentation

## 2018-12-28 HISTORY — PX: CARPOMETACARPAL (CMC) FUSION OF THUMB: SHX6290

## 2018-12-28 SURGERY — CARPOMETACARPAL (CMC) FUSION OF THUMB
Anesthesia: Regional | Site: Thumb | Laterality: Left

## 2018-12-28 MED ORDER — PHENYLEPHRINE 40 MCG/ML (10ML) SYRINGE FOR IV PUSH (FOR BLOOD PRESSURE SUPPORT)
PREFILLED_SYRINGE | INTRAVENOUS | Status: AC
  Filled 2018-12-28: qty 10

## 2018-12-28 MED ORDER — FENTANYL CITRATE (PF) 100 MCG/2ML IJ SOLN
INTRAMUSCULAR | Status: AC
  Filled 2018-12-28: qty 2

## 2018-12-28 MED ORDER — HYDROMORPHONE HCL 1 MG/ML IJ SOLN: 0.2500 mg | INTRAMUSCULAR | Status: DC | PRN

## 2018-12-28 MED ORDER — SUCCINYLCHOLINE CHLORIDE 200 MG/10ML IV SOSY
PREFILLED_SYRINGE | INTRAVENOUS | Status: AC
  Filled 2018-12-28: qty 10

## 2018-12-28 MED ORDER — GLYCOPYRROLATE PF 0.2 MG/ML IJ SOSY
PREFILLED_SYRINGE | INTRAMUSCULAR | Status: AC
  Filled 2018-12-28: qty 1

## 2018-12-28 MED ORDER — LIDOCAINE 2% (20 MG/ML) 5 ML SYRINGE
INTRAMUSCULAR | Status: AC
  Filled 2018-12-28: qty 5

## 2018-12-28 MED ORDER — MIDAZOLAM HCL 2 MG/2ML IJ SOLN
1.0000 mg | INTRAMUSCULAR | Status: DC | PRN
  Administered 2018-12-28: 11:00:00 2 mg via INTRAVENOUS

## 2018-12-28 MED ORDER — CEFAZOLIN SODIUM-DEXTROSE 2-4 GM/100ML-% IV SOLN: 2.0000 g | INTRAVENOUS | Status: DC

## 2018-12-28 MED ORDER — BUPIVACAINE-EPINEPHRINE (PF) 0.5% -1:200000 IJ SOLN
INTRAMUSCULAR | Status: DC | PRN
  Administered 2018-12-28: 30 mL via PERINEURAL

## 2018-12-28 MED ORDER — LACTATED RINGERS IV SOLN
INTRAVENOUS | Status: DC
  Administered 2018-12-28: 11:00:00 via INTRAVENOUS

## 2018-12-28 MED ORDER — MIDAZOLAM HCL 2 MG/2ML IJ SOLN
INTRAMUSCULAR | Status: AC
  Filled 2018-12-28: qty 2

## 2018-12-28 MED ORDER — ONDANSETRON HCL 4 MG/2ML IJ SOLN
INTRAMUSCULAR | Status: AC
  Filled 2018-12-28: qty 2

## 2018-12-28 MED ORDER — CHLORHEXIDINE GLUCONATE 4 % EX LIQD: 60.0000 mL | Freq: Once | CUTANEOUS | Status: DC

## 2018-12-28 MED ORDER — ONDANSETRON HCL 4 MG/2ML IJ SOLN: 4.0000 mg | Freq: Once | INTRAMUSCULAR | Status: DC | PRN

## 2018-12-28 MED ORDER — MEPERIDINE HCL 25 MG/ML IJ SOLN: 6.2500 mg | INTRAMUSCULAR | Status: DC | PRN

## 2018-12-28 MED ORDER — EPHEDRINE 5 MG/ML INJ
INTRAVENOUS | Status: AC
  Filled 2018-12-28: qty 10

## 2018-12-28 MED ORDER — TRAMADOL HCL 50 MG PO TABS: 50.0000 mg | ORAL_TABLET | Freq: Four times a day (QID) | ORAL | 0 refills | Status: DC | PRN

## 2018-12-28 MED ORDER — PROPOFOL 500 MG/50ML IV EMUL
INTRAVENOUS | Status: DC | PRN
  Administered 2018-12-28: 100 ug/kg/min via INTRAVENOUS

## 2018-12-28 MED ORDER — FENTANYL CITRATE (PF) 100 MCG/2ML IJ SOLN
100.0000 ug | INTRAMUSCULAR | Status: DC | PRN
  Administered 2018-12-28: 100 ug via INTRAVENOUS
  Administered 2018-12-28: 25 ug via INTRAVENOUS

## 2018-12-28 SURGICAL SUPPLY — 68 items
BIT DRILL ACUTRAK MICRO 2 (BIT) IMPLANT
BIT DRILL MICR ACTRK 2 LNG PRF (BIT) IMPLANT
BLADE MINI RND TIP GREEN BEAV (BLADE) ×3 IMPLANT
BLADE SURG 15 STRL LF DISP TIS (BLADE) ×2 IMPLANT
BLADE SURG 15 STRL SS (BLADE) ×4
BNDG COHESIVE 2X5 TAN STRL LF (GAUZE/BANDAGES/DRESSINGS) ×2 IMPLANT
BNDG ELASTIC 3X5.8 VLCR STR LF (GAUZE/BANDAGES/DRESSINGS) IMPLANT
BNDG ESMARK 4X9 LF (GAUZE/BANDAGES/DRESSINGS) ×3 IMPLANT
BNDG GAUZE ELAST 4 BULKY (GAUZE/BANDAGES/DRESSINGS) ×3 IMPLANT
CHLORAPREP W/TINT 26 (MISCELLANEOUS) ×3 IMPLANT
CORD BIPOLAR FORCEPS 12FT (ELECTRODE) ×3 IMPLANT
COVER BACK TABLE REUSABLE LG (DRAPES) ×3 IMPLANT
COVER MAYO STAND REUSABLE (DRAPES) ×3 IMPLANT
COVER WAND RF STERILE (DRAPES) IMPLANT
CUFF TOURN SGL QUICK 18X4 (TOURNIQUET CUFF) ×3 IMPLANT
DECANTER SPIKE VIAL GLASS SM (MISCELLANEOUS) IMPLANT
DRAPE EXTREMITY T 121X128X90 (DISPOSABLE) ×3 IMPLANT
DRAPE OEC MINIVIEW 54X84 (DRAPES) ×2 IMPLANT
DRAPE SURG 17X23 STRL (DRAPES) ×3 IMPLANT
DRILL ACUTRAK MICRO 2 (BIT) ×3
DRILL MICRO ACUTRAK 2 LNG PROF (BIT) ×3
DRSG PAD ABDOMINAL 8X10 ST (GAUZE/BANDAGES/DRESSINGS) IMPLANT
GAUZE SPONGE 4X4 12PLY STRL (GAUZE/BANDAGES/DRESSINGS) ×3 IMPLANT
GAUZE XEROFORM 1X8 LF (GAUZE/BANDAGES/DRESSINGS) ×3 IMPLANT
GLOVE BIO SURGEON STRL SZ7.5 (GLOVE) ×5 IMPLANT
GLOVE BIOGEL PI IND STRL 8 (GLOVE) ×1 IMPLANT
GLOVE BIOGEL PI INDICATOR 8 (GLOVE) ×4
GLOVE SURG ORTHO 8.0 STRL STRW (GLOVE) ×2 IMPLANT
GOWN STRL REUS W/ TWL LRG LVL3 (GOWN DISPOSABLE) ×1 IMPLANT
GOWN STRL REUS W/TWL LRG LVL3 (GOWN DISPOSABLE) ×2
GOWN STRL REUS W/TWL XL LVL3 (GOWN DISPOSABLE) ×3 IMPLANT
GUIDEWIRE ORTHO MICROSHT  ACUT (WIRE) ×2
GUIDEWIRE ORTHO MICROSHT .035 (WIRE) ×2 IMPLANT
K-WIRE .035X4 (WIRE) IMPLANT
NDL KEITH (NEEDLE) IMPLANT
NDL SAFETY ECLIPSE 18X1.5 (NEEDLE) IMPLANT
NEEDLE HYPO 18GX1.5 SHARP (NEEDLE) ×2
NEEDLE KEITH (NEEDLE) IMPLANT
NS IRRIG 1000ML POUR BTL (IV SOLUTION) ×3 IMPLANT
PACK BASIN DAY SURGERY FS (CUSTOM PROCEDURE TRAY) ×3 IMPLANT
PAD CAST 4YDX4 CTTN HI CHSV (CAST SUPPLIES) ×1 IMPLANT
PADDING CAST ABS 4INX4YD NS (CAST SUPPLIES) ×2
PADDING CAST ABS COTTON 4X4 ST (CAST SUPPLIES) ×1 IMPLANT
PADDING CAST COTTON 4X4 STRL (CAST SUPPLIES) ×2
PASSER SUT SWANSON 36MM LOOP (INSTRUMENTS) IMPLANT
SCREW ACUTRAK 2 MICRO 16MM (Screw) ×2 IMPLANT
SCREW ACUTRAK 2 MICRO 18MM (Screw) ×2 IMPLANT
SLEEVE SCD COMPRESS KNEE MED (MISCELLANEOUS) ×3 IMPLANT
SLING ARM FOAM STRAP MED (SOFTGOODS) ×2 IMPLANT
SPLINT PLASTER CAST XFAST 4X15 (CAST SUPPLIES) IMPLANT
SPLINT PLASTER XTRA FAST SET 4 (CAST SUPPLIES)
STOCKINETTE 4X48 STRL (DRAPES) ×3 IMPLANT
SUT CHROMIC 5 0 P 3 (SUTURE) ×2 IMPLANT
SUT ETHIBOND 3-0 V-5 (SUTURE) ×3 IMPLANT
SUT ETHILON 3 0 PS 1 (SUTURE) IMPLANT
SUT ETHILON 4 0 PS 2 18 (SUTURE) ×2 IMPLANT
SUT FIBERWIRE 2-0 18 17.9 3/8 (SUTURE)
SUT MERSILENE 2.0 SH NDLE (SUTURE) IMPLANT
SUT MERSILENE 4 0 P 3 (SUTURE) IMPLANT
SUT PROLENE 5 0 PS 2 (SUTURE) IMPLANT
SUT SILK 4 0 PS 2 (SUTURE) IMPLANT
SUT VIC AB 0 SH 27 (SUTURE) IMPLANT
SUT VICRYL 4-0 PS2 18IN ABS (SUTURE) ×2 IMPLANT
SUTURE FIBERWR 2-0 18 17.9 3/8 (SUTURE) IMPLANT
SYR BULB 3OZ (MISCELLANEOUS) ×3 IMPLANT
SYR CONTROL 10ML LL (SYRINGE) ×2 IMPLANT
TOWEL GREEN STERILE FF (TOWEL DISPOSABLE) ×6 IMPLANT
UNDERPAD 30X36 HEAVY ABSORB (UNDERPADS AND DIAPERS) ×3 IMPLANT

## 2018-12-28 NOTE — Transfer of Care (Signed)
Immediate Anesthesia Transfer of Care Note  Patient: Beth Lynch  Procedure(s) Performed: FUSION METACARPAL PHANLAGEAL LEFT THUMB (Left Thumb)  Patient Location: PACU  Anesthesia Type:MAC combined with regional for post-op pain  Level of Consciousness: awake, alert , oriented and drowsy  Airway & Oxygen Therapy: Patient Spontanous Breathing and Patient connected to face mask oxygen  Post-op Assessment: Report given to RN and Post -op Vital signs reviewed and stable  Post vital signs: Reviewed and stable  Last Vitals:  Vitals Value Taken Time  BP    Temp    Pulse 66 12/28/18 1319  Resp 12 12/28/18 1319  SpO2 98 % 12/28/18 1319  Vitals shown include unvalidated device data.  Last Pain:  Vitals:   12/28/18 1033  TempSrc: Oral  PainSc: 0-No pain         Complications: No apparent anesthesia complications

## 2018-12-28 NOTE — Op Note (Signed)
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   ASSISTANT: Leanora Cover, MD   ANESTHESIA:  Regional with sedation   INTRAVENOUS FLUIDS:  Per anesthesia flow sheet.   ESTIMATED BLOOD LOSS:  Minimal.   COMPLICATIONS:  None.   SPECIMENS:  none   TOURNIQUET TIME:    Total Tourniquet Time Documented: Upper Arm (Left) - 46 minutes Total: Upper Arm (Left) - 46 minutes    DISPOSITION:  Stable to PACU.   INDICATIONS: Patient is a 63 year old female who has undergone a suspension plasty of her left thumb many years ago she has developed instability of the metacarpal phalangeal joint with discomfort and is beginning to have symptoms related to the [facility] joint secondary to the instability of the metacarpal phalangeal joint.  She is admitted for fusion of the metacarpal phalangeal joint left thumb.  Preperi-and postoperative course been discussed along with risk complications.  She is aware that there is no guarantee to the surgery the possibility of infection recurrence injury to arteries nerves tendons incomplete relief of symptoms just possibility of nonunion.  Preoperative area the patient is seen extremity marked by both patient and surgeon antibiotic given  OPERATIVE COURSE: Patient is brought to the operating room after supraclavicular block was carried out without difficulty under the direction the anesthesia department in the preoperative area.  She was prepped using ChloraPrep in a supine position with the left arm free.  3-minute dry time was allowed timeout taken to confirm patient  procedure.  The limb was exsanguinated with an Esmarch bandage turn placed on the arm was inflated to 250 mmHg.  A longitudinal incision was made over the metacarpal phalangeal joint of the left thumb carried down through subcutaneous tissue.  Bleeders were electrocauterized with bipolar.  The interval between the extensor pollicis longus and extensor pollicis brevis was then incised.  The capsule was then incised allowing visualization of the joint the instability noted.  The collateral ligaments were then released from the metacarpal radially and ulnarly.  Rongeurs were then used to remove the cartilage subchondral bone of the distal metacarpal and then drill holes were placed around the periphery of the proximal phalanx articular surface these were then completed allowing the subcu subchondral bone with cartilage removed forming a Tina mortise type fusion site.  The bone was irrigated compressed guide screws for 2 micro AccuTrack screws were placed on the radial and ulnar aspect of the metacarpal head confirmed in position with the image intensification both AP lateral directions.  A 16 and 18 mm micro screw was then inserted after drilling through the cortex of the metacarpal distally.  These were instilled and firmly compressed the metacarpal phalangeal joint with excellent stability and approximately 10 degrees of flexion.  Extremities confirm positioning.  The wound was irrigated with saline.  The capsule closed with a running 5-0 chromic sutures the extensor tendon was then repaired with a running 5-0 Vicryl suture.  The skin was then closed with interrupted 4-0 nylon sutures.  A sterile compressive dressing dorsal palmar thumb spica splint applied.  Deflation of the tourniquet all fingers  immediately pink.  She was taken to the recovery room for observation in satisfactory condition.  She will be discharged home to return [place] in 1 week Tylenol ibuprofen for pain with tramadol for  backup.   Cindee Salt, MD Electronically signed, 12/28/18

## 2018-12-28 NOTE — H&P (Signed)
Beth Lynch is an 63 y.o. female.   Chief Complaint: Arthritis and instability metacarpal phalangeal joint left thumb HPI: Beth Lynch is a 63yo female post [facility] arthritis reconstruction left thumb. She is complaining of instability at the metacarpal phalangeal joint hyperextension at this time. States this has become painful for her. States that she is now having some effect on the [facility] reconstruction for her. She has no new injury she states nothing makes it better or worse. She has been wearing one of her soft brace to give her some support.   Past Medical History:  Diagnosis Date  . Multiple thyroid nodules   . Thyroid disease   . Wears glasses     Past Surgical History:  Procedure Laterality Date  . BACK SURGERY  2014   lumb fusion  . CARPOMETACARPEL SUSPENSION PLASTY Left 03/17/2017   Procedure: SUSPENSIONPLASTY LEFT THUMB PARTIAL TRAPEZOID EXCISION ABDUCTOR POLLICIS LONGUS TRANSFER TRAPAZIUM EXCISION;  Surgeon: Cindee Salt, MD;  Location: [facility];  Service: Orthopedics;  Laterality: Left;  AXILLARY BLOCK  . CHOLECYSTECTOMY    . COLONOSCOPY    . COLONOSCOPY N/A 04/28/2017   Procedure: COLONOSCOPY;  Surgeon: Malissa Hippo, MD;  [facility];  Service: Endoscopy;  Laterality: N/A;  730  . LIPOMA EXCISION     shoulder  . TONSILLECTOMY    . TRIGGER FINGER RELEASE Right 12/27/2013   Procedure: RELEASE TRIGGER FINGER/A-1 PULLEY RIGHT MIDDLE FINGER;  Surgeon: Cindee Salt, MD;  Location: [facility];  Service: Orthopedics;  Laterality: Right;  . TRIGGER FINGER RELEASE Left 02/17/2018   Procedure: RELEASE TRIGGER FINGER/A-1 PULLEY LEFT MIDDLE FINGER;  Surgeon: Cindee Salt, MD;  Location: [facility];  Service: Orthopedics;  Laterality: Left;  . TUBAL LIGATION      Family History  Problem Relation Age of Onset  . Breast cancer Mother   . Breast cancer Maternal Aunt   . Breast cancer Maternal Grandmother   . Colon cancer Neg Hx     Social History:  reports that she has never smoked. She has never used smokeless tobacco. She reports current alcohol use. She reports that she does not use drugs.  Allergies:  Allergies  Allergen Reactions  . Shellfish Allergy Anaphylaxis  . Alendronate Sodium Hives  . Calcium-Containing Compounds Hives    No reaction to LR in the past per pt 02/07/18  . Codeine Hives  . Imitrex [Sumatriptan] Other (See Comments)    Breast lump  . Latex Hives  . Nsaids Hives  . Betadine [Povidone Iodine] Rash  . Hydrocodone Hives, Itching and Rash  . Oxycodone Hives, Itching and Rash  . Tetracycline Rash    Medications Prior to Admission  Medication Sig Dispense Refill  . acetaminophen (TYLENOL) 500 MG tablet Take 1,000 mg by mouth every 6 (six) hours as needed.    . cholecalciferol (VITAMIN D) 1000 units tablet Take 1,000 Units by mouth at bedtime.     . cycloSPORINE (RESTASIS) 0.05 % ophthalmic emulsion Place 1 drop into both eyes 2 (two) times daily.    Marland Kitchen levocetirizine (XYZAL) 5 MG tablet Take 5 mg by mouth at bedtime.     . traMADol (ULTRAM) 50 MG tablet Take 1 tablet (50 mg total) by mouth every 6 (six) hours as needed. 20 tablet 0    No results found for this or any previous visit (from the past 48 hour(s)).  No results found.   Pertinent items are noted in HPI.  Height  5\' 6"  (1.676 m), weight 56.2 kg.  General appearance: alert, cooperative and appears stated age Head: Normocephalic, without obvious abnormality Neck: no JVD Resp: clear to auscultation bilaterally Cardio: regular rate and rhythm, S1, S2 normal, no murmur, click, rub or gallop GI: soft, non-tender; bowel sounds normal; no masses,  no organomegaly Extremities: pain and instability metacarpal phalangeal joint left thumb Pulses: 2+ and symmetric Skin: Skin color, texture, turgor normal. 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 We have discussed this and her suspension plasty she declined to have it done at that time. Preperi-and postoperative course are discussed along with risk and complications. She is aware there is no guarantee to the surgery the possibility of infection recurrence injury to arteries nerves tendons complete relief of symptoms discussed possibility of nonunion. She would like to proceed and is scheduled for fusion metacarpal phalangeal joint left thumb as an outpatient under regional anesthesia. This will be done with AccuTrack screws if possible.   Daryll Brod 12/28/2018, 10:23 AM

## 2018-12-28 NOTE — Anesthesia Preprocedure Evaluation (Signed)
Anesthesia Evaluation  Patient identified by MRN, date of birth, ID band Patient awake    Reviewed: Allergy & Precautions, NPO status , Patient's Chart, lab work & pertinent test results  Airway Mallampati: I  TM Distance: >3 FB Neck ROM: Full    Dental   Pulmonary    Pulmonary exam normal        Cardiovascular Normal cardiovascular exam     Neuro/Psych    GI/Hepatic   Endo/Other    Renal/GU      Musculoskeletal   Abdominal   Peds  Hematology   Anesthesia Other Findings   Reproductive/Obstetrics                             Anesthesia Physical Anesthesia Plan  ASA: II  Anesthesia Plan: Regional   Post-op Pain Management:    Induction: Intravenous  PONV Risk Score and Plan: 2  Airway Management Planned: Simple Face Mask  Additional Equipment:   Intra-op Plan:   Post-operative Plan:   Informed Consent: I have reviewed the patients History and Physical, chart, labs and discussed the procedure including the risks, benefits and alternatives for the proposed anesthesia with the patient or authorized representative who has indicated his/her understanding and acceptance.       Plan Discussed with: CRNA and Surgeon  Anesthesia Plan Comments:         Anesthesia Quick Evaluation

## 2018-12-28 NOTE — Op Note (Signed)
I assisted Surgeon(s) and Role:    * Leanora Cover, MD - Primary    Daryll Brod, MD on the Procedure(s): [place] on 12/28/2018.  I provided assistance on this case as follows: retraction soft tissues, preparation of fusion site, placement hardware.  Electronically signed by: Leanora Cover, MD Date: 12/28/2018 Time: 1:15 PM

## 2018-12-28 NOTE — Progress Notes (Signed)
Assisted Dr. Ossey with left, ultrasound guided, supraclavicular block. Side rails up, monitors on throughout procedure. See vital signs in flow sheet. Tolerated Procedure well. 

## 2018-12-28 NOTE — Anesthesia Procedure Notes (Signed)
Anesthesia Regional Block: Supraclavicular block   Pre-Anesthetic Checklist: ,, timeout performed, Correct Patient, Correct Site, Correct Laterality, Correct Procedure, Correct Position, site marked, Risks and benefits discussed,  Surgical consent,  Pre-op evaluation,  At surgeon's request and post-op pain management  Laterality: Left  Prep: chloraprep       Needles:   Needle Type: Echogenic Stimulator Needle     Needle Length: 9cm  Needle Gauge: 21     Additional Needles:   Procedures:, nerve stimulator,,,,,,,   Nerve Stimulator or Paresthesia:  Response: 0.4 mA,   Additional Responses:   Narrative:  Start time: 12/28/2018 11:40 AM End time: 12/28/2018 11:50 AM Injection made incrementally with aspirations every 5 mL.  Performed by: Personally  Anesthesiologist: Lillia Abed, MD  Additional Notes: Monitors applied. Patient sedated. Sterile prep and drape,hand hygiene and sterile gloves were used. Relevant anatomy identified.Needle position confirmed.Local anesthetic injected incrementally after negative aspiration. Local anesthetic spread visualized around nerve(s). Vascular puncture avoided. No complications. Image printed for medical record.The patient tolerated the procedure well.

## 2018-12-28 NOTE — Brief Op Note (Signed)
12/28/2018  1:14 PM  PATIENT:  Beth Lynch  63 y.o. female  PRE-OPERATIVE DIAGNOSIS:  METACARPAL  PHANLANGEAL ARTHRITIS LEFT THUMB  POST-OPERATIVE DIAGNOSIS:  METACARPAL  PHANLANGEAL ARTHRITIS LEFT THUMB  PROCEDURE:  Procedure(s) with comments: FUSION METACARPAL PHANLAGEAL LEFT THUMB (Left) - AXILLARY BLOCK  SURGEON:  Surgeon(s) and Role:    * Leanora Cover, MD - Primary    * Daryll Brod, MD  PHYSICIAN ASSISTANT:   ASSISTANTS: K Demetria Lightsey,MD   ANESTHESIA:   regional and IV sedation  EBL:  50ml  BLOOD ADMINISTERED:none  DRAINS: none   LOCAL MEDICATIONS USED:  NONE  SPECIMEN:  No Specimen  DISPOSITION OF SPECIMEN:  N/A  COUNTS:  YES  TOURNIQUET:   Total Tourniquet Time Documented: Upper Arm (Left) - 46 minutes Total: Upper Arm (Left) - 46 minutes   DICTATION: .Viviann Spare Dictation  PLAN OF CARE: Discharge to home after PACU  PATIENT DISPOSITION:  PACU - hemodynamically stable.

## 2018-12-28 NOTE — Discharge Instructions (Addendum)

## 2018-12-28 NOTE — Anesthesia Postprocedure Evaluation (Signed)
Anesthesia Post Note  Patient: Beth Lynch  Procedure(s) Performed: FUSION METACARPAL PHANLAGEAL LEFT THUMB (Left Thumb)     Patient location during evaluation: PACU Anesthesia Type: Regional Level of consciousness: awake and alert and patient cooperative Pain management: pain level controlled Vital Signs Assessment: post-procedure vital signs reviewed and stable Respiratory status: spontaneous breathing and respiratory function stable Cardiovascular status: stable Anesthetic complications: no    Last Vitals:  Vitals:   12/28/18 1345 12/28/18 1417  BP: 130/74 127/78  Pulse: (!) 58   Resp: 10   Temp:  36.6 C  SpO2: 98% 100%    Last Pain:  Vitals:   12/28/18 1417  TempSrc:   PainSc: 0-No pain                 Tenise Stetler DAVID

## 2018-12-29 ENCOUNTER — Encounter (HOSPITAL_BASED_OUTPATIENT_CLINIC_OR_DEPARTMENT_OTHER): Payer: Self-pay | Admitting: Orthopedic Surgery

## 2019-02-26 ENCOUNTER — Other Ambulatory Visit: Payer: Self-pay

## 2019-02-26 ENCOUNTER — Ambulatory Visit: Payer: Medicare PPO | Attending: Internal Medicine

## 2019-02-26 DIAGNOSIS — Z20822 Contact with and (suspected) exposure to covid-19: Secondary | ICD-10-CM | POA: Insufficient documentation

## 2019-02-28 LAB — NOVEL CORONAVIRUS, NAA: SARS-CoV-2, NAA: NOT DETECTED

## 2019-03-17 ENCOUNTER — Ambulatory Visit: Payer: Medicare PPO | Attending: Internal Medicine

## 2019-03-17 DIAGNOSIS — Z23 Encounter for immunization: Secondary | ICD-10-CM | POA: Insufficient documentation

## 2019-03-17 NOTE — Progress Notes (Signed)
   Covid-19 Vaccination Clinic  Name:  Beth Lynch    MRN: [id number] DOB: 07/08/1955  03/17/2019  Ms. Near was observed post Covid-19 immunization for 30 minutes based on pre-vaccination screening without incidence. She was provided with Vaccine Information Sheet and instruction to access the V-Safe system.   [name] was instructed to call 911 with any severe reactions post vaccine: Marland Kitchen Difficulty breathing  . Swelling of your face and throat  . A fast heartbeat  . A bad rash all over your body  . Dizziness and weakness    Immunizations Administered    Name Date Dose VIS Date Route   Pfizer COVID-19 Vaccine 03/17/2019 10:15 AM 0.3 mL 02/02/2019 Intramuscular   Manufacturer: [employer], [employer]   Lot: [id number]   NDC: [address]-0

## 2019-03-21 ENCOUNTER — Ambulatory Visit: Payer: Self-pay

## 2019-03-27 ENCOUNTER — Ambulatory Visit: Payer: Self-pay

## 2019-04-07 ENCOUNTER — Ambulatory Visit: Payer: Medicare PPO | Attending: Internal Medicine

## 2019-04-07 ENCOUNTER — Ambulatory Visit: Payer: Self-pay

## 2019-04-07 DIAGNOSIS — Z23 Encounter for immunization: Secondary | ICD-10-CM | POA: Insufficient documentation

## 2019-04-07 NOTE — Progress Notes (Signed)
   Covid-19 Vaccination Clinic  Name:  KEYLI DUROSS    MRN: [id number] DOB: [birth year]  04/07/2019  Ms. Eckroth was observed post Covid-19 immunization for 15 minutes without incidence. She was provided with Vaccine Information Sheet and instruction to access the V-Safe system.   Ms. Nudo was instructed to call 911 with any severe reactions post vaccine: Marland Kitchen Difficulty breathing  . Swelling of your face and throat  . A fast heartbeat  . A bad rash all over your body  . Dizziness and weakness    Immunizations Administered    Name Date Dose VIS Date Route   Pfizer COVID-19 Vaccine 04/07/2019 11:59 AM 0.3 mL 02/02/2019 Intramuscular   Manufacturer: [employer], [employer]   Lot: [id number]   NDC: [address]-7

## 2019-10-16 ENCOUNTER — Other Ambulatory Visit: Payer: Self-pay | Admitting: Obstetrics and Gynecology

## 2019-10-16 DIAGNOSIS — Z1231 Encounter for screening mammogram for malignant neoplasm of breast: Secondary | ICD-10-CM

## 2019-10-17 ENCOUNTER — Ambulatory Visit
Admission: RE | Admit: 2019-10-17 | Discharge: 2019-10-17 | Disposition: A | Discharge status: Home / Self Care | Payer: Medicare PPO | Source: Ambulatory Visit | Attending: Obstetrics and Gynecology | Admitting: Obstetrics and Gynecology

## 2019-10-17 ENCOUNTER — Other Ambulatory Visit: Payer: Self-pay

## 2019-10-17 DIAGNOSIS — Z1231 Encounter for screening mammogram for malignant neoplasm of breast: Secondary | ICD-10-CM

## 2019-11-01 ENCOUNTER — Ambulatory Visit: Payer: Medicare PPO | Attending: Internal Medicine

## 2019-11-01 DIAGNOSIS — Z23 Encounter for immunization: Secondary | ICD-10-CM

## 2019-11-01 NOTE — Progress Notes (Signed)
   Covid-19 Vaccination Clinic  Name:  Beth Lynch    MRN: [id number] DOB: [birth year]  11/01/2019  Beth Lynch was observed post Covid-19 immunization for 30 minutes based on pre-vaccination screening without incident. She was provided with Vaccine Information Sheet and instruction to access the V-Safe system.   Beth Lynch was instructed to call 911 with any severe reactions post vaccine: Marland Kitchen Difficulty breathing  . Swelling of face and throat  . A fast heartbeat  . A bad rash all over body  . Dizziness and weakness

## 2020-04-04 DIAGNOSIS — M5134 Other intervertebral disc degeneration, thoracic region: Secondary | ICD-10-CM | POA: Diagnosis not present

## 2020-04-04 DIAGNOSIS — M549 Dorsalgia, unspecified: Secondary | ICD-10-CM | POA: Diagnosis not present

## 2020-04-04 DIAGNOSIS — M546 Pain in thoracic spine: Secondary | ICD-10-CM | POA: Diagnosis not present

## 2020-04-04 DIAGNOSIS — Z9889 Other specified postprocedural states: Secondary | ICD-10-CM | POA: Diagnosis not present

## 2020-04-04 DIAGNOSIS — M48061 Spinal stenosis, lumbar region without neurogenic claudication: Secondary | ICD-10-CM | POA: Diagnosis not present

## 2020-04-09 DIAGNOSIS — M5125 Other intervertebral disc displacement, thoracolumbar region: Secondary | ICD-10-CM | POA: Diagnosis not present

## 2020-04-09 DIAGNOSIS — M546 Pain in thoracic spine: Secondary | ICD-10-CM | POA: Diagnosis not present

## 2020-04-16 DIAGNOSIS — M5125 Other intervertebral disc displacement, thoracolumbar region: Secondary | ICD-10-CM | POA: Diagnosis not present

## 2020-04-16 DIAGNOSIS — M546 Pain in thoracic spine: Secondary | ICD-10-CM | POA: Diagnosis not present

## 2020-04-17 DIAGNOSIS — Z01419 Encounter for gynecological examination (general) (routine) without abnormal findings: Secondary | ICD-10-CM | POA: Diagnosis not present

## 2020-04-17 DIAGNOSIS — Z6821 Body mass index (BMI) 21.0-21.9, adult: Secondary | ICD-10-CM | POA: Diagnosis not present

## 2020-04-17 DIAGNOSIS — Z124 Encounter for screening for malignant neoplasm of cervix: Secondary | ICD-10-CM | POA: Diagnosis not present

## 2020-04-23 DIAGNOSIS — M5125 Other intervertebral disc displacement, thoracolumbar region: Secondary | ICD-10-CM | POA: Diagnosis not present

## 2020-04-23 DIAGNOSIS — M546 Pain in thoracic spine: Secondary | ICD-10-CM | POA: Diagnosis not present

## 2020-04-24 DIAGNOSIS — J019 Acute sinusitis, unspecified: Secondary | ICD-10-CM | POA: Diagnosis not present

## 2020-04-28 DIAGNOSIS — M546 Pain in thoracic spine: Secondary | ICD-10-CM | POA: Diagnosis not present

## 2020-04-28 DIAGNOSIS — M816 Localized osteoporosis [Lequesne]: Secondary | ICD-10-CM | POA: Diagnosis not present

## 2020-04-28 DIAGNOSIS — N958 Other specified menopausal and perimenopausal disorders: Secondary | ICD-10-CM | POA: Diagnosis not present

## 2020-04-28 DIAGNOSIS — M5125 Other intervertebral disc displacement, thoracolumbar region: Secondary | ICD-10-CM | POA: Diagnosis not present

## 2020-04-29 DIAGNOSIS — L821 Other seborrheic keratosis: Secondary | ICD-10-CM | POA: Diagnosis not present

## 2020-04-29 DIAGNOSIS — L570 Actinic keratosis: Secondary | ICD-10-CM | POA: Diagnosis not present

## 2020-04-29 DIAGNOSIS — D485 Neoplasm of uncertain behavior of skin: Secondary | ICD-10-CM | POA: Diagnosis not present

## 2020-04-29 DIAGNOSIS — L245 Irritant contact dermatitis due to other chemical products: Secondary | ICD-10-CM | POA: Diagnosis not present

## 2020-04-29 DIAGNOSIS — B079 Viral wart, unspecified: Secondary | ICD-10-CM | POA: Diagnosis not present

## 2020-04-29 DIAGNOSIS — D2262 Melanocytic nevi of left upper limb, including shoulder: Secondary | ICD-10-CM | POA: Diagnosis not present

## 2020-04-29 DIAGNOSIS — D2261 Melanocytic nevi of right upper limb, including shoulder: Secondary | ICD-10-CM | POA: Diagnosis not present

## 2020-04-29 DIAGNOSIS — L720 Epidermal cyst: Secondary | ICD-10-CM | POA: Diagnosis not present

## 2020-04-29 DIAGNOSIS — L564 Polymorphous light eruption: Secondary | ICD-10-CM | POA: Diagnosis not present

## 2020-04-29 DIAGNOSIS — D225 Melanocytic nevi of trunk: Secondary | ICD-10-CM | POA: Diagnosis not present

## 2020-05-30 DIAGNOSIS — Z981 Arthrodesis status: Secondary | ICD-10-CM | POA: Diagnosis not present

## 2020-05-30 DIAGNOSIS — Z4789 Encounter for other orthopedic aftercare: Secondary | ICD-10-CM | POA: Diagnosis not present

## 2020-05-30 DIAGNOSIS — Z9889 Other specified postprocedural states: Secondary | ICD-10-CM | POA: Diagnosis not present

## 2020-06-04 ENCOUNTER — Ambulatory Visit: Payer: Medicare PPO | Attending: Internal Medicine

## 2020-06-04 DIAGNOSIS — Z23 Encounter for immunization: Secondary | ICD-10-CM

## 2020-06-04 NOTE — Progress Notes (Signed)
   Covid-19 Vaccination Clinic  Name:  Beth Lynch    MRN: [id number] DOB: [birth year]  06/04/2020  [name] was observed post Covid-19 immunization for 15 minutes without incident. She was provided with Vaccine Information Sheet and instruction to access the V-Safe system.   [name] was instructed to call 911 with any severe reactions post vaccine: Marland Kitchen Difficulty breathing  . Swelling of face and throat  . A fast heartbeat  . A bad rash all over body  . Dizziness and weakness   Immunizations Administered    Name Date Dose VIS Date Route   PFIZER Comrnaty(Gray TOP) Covid-19 Vaccine 06/04/2020 11:12 AM 0.3 mL 01/31/2020 Intramuscular   Manufacturer: [employer], [employer]   Lot: [id number]   NDC: [phone]

## 2020-06-10 ENCOUNTER — Other Ambulatory Visit (HOSPITAL_COMMUNITY): Payer: Self-pay

## 2020-06-10 MED ORDER — COVID-19 MRNA VAC-TRIS(PFIZER) 30 MCG/0.3ML IM SUSP
INTRAMUSCULAR | 0 refills | Status: DC
  Filled 2020-06-10: qty 0.3, 17d supply, fill #0

## 2020-06-11 ENCOUNTER — Other Ambulatory Visit (HOSPITAL_COMMUNITY): Payer: Self-pay

## 2020-08-13 DIAGNOSIS — H2513 Age-related nuclear cataract, bilateral: Secondary | ICD-10-CM | POA: Diagnosis not present

## 2020-08-13 DIAGNOSIS — H43813 Vitreous degeneration, bilateral: Secondary | ICD-10-CM | POA: Diagnosis not present

## 2020-08-13 DIAGNOSIS — H524 Presbyopia: Secondary | ICD-10-CM | POA: Diagnosis not present

## 2020-08-13 DIAGNOSIS — H04123 Dry eye syndrome of bilateral lacrimal glands: Secondary | ICD-10-CM | POA: Diagnosis not present

## 2020-08-20 DIAGNOSIS — L245 Irritant contact dermatitis due to other chemical products: Secondary | ICD-10-CM | POA: Diagnosis not present

## 2020-08-20 DIAGNOSIS — L920 Granuloma annulare: Secondary | ICD-10-CM | POA: Diagnosis not present

## 2020-08-20 DIAGNOSIS — L718 Other rosacea: Secondary | ICD-10-CM | POA: Diagnosis not present

## 2020-08-20 DIAGNOSIS — L309 Dermatitis, unspecified: Secondary | ICD-10-CM | POA: Diagnosis not present

## 2020-11-04 DIAGNOSIS — L858 Other specified epidermal thickening: Secondary | ICD-10-CM | POA: Diagnosis not present

## 2020-11-04 DIAGNOSIS — L718 Other rosacea: Secondary | ICD-10-CM | POA: Diagnosis not present

## 2020-11-04 DIAGNOSIS — D225 Melanocytic nevi of trunk: Secondary | ICD-10-CM | POA: Diagnosis not present

## 2020-11-04 DIAGNOSIS — L920 Granuloma annulare: Secondary | ICD-10-CM | POA: Diagnosis not present

## 2020-11-26 DIAGNOSIS — L821 Other seborrheic keratosis: Secondary | ICD-10-CM | POA: Diagnosis not present

## 2020-11-26 DIAGNOSIS — L738 Other specified follicular disorders: Secondary | ICD-10-CM | POA: Diagnosis not present

## 2020-11-26 DIAGNOSIS — L920 Granuloma annulare: Secondary | ICD-10-CM | POA: Diagnosis not present

## 2020-12-03 DIAGNOSIS — M810 Age-related osteoporosis without current pathological fracture: Secondary | ICD-10-CM | POA: Diagnosis not present

## 2020-12-03 DIAGNOSIS — Z13220 Encounter for screening for lipoid disorders: Secondary | ICD-10-CM | POA: Diagnosis not present

## 2020-12-03 DIAGNOSIS — Z0001 Encounter for general adult medical examination with abnormal findings: Secondary | ICD-10-CM | POA: Diagnosis not present

## 2020-12-03 DIAGNOSIS — Z23 Encounter for immunization: Secondary | ICD-10-CM | POA: Diagnosis not present

## 2020-12-03 DIAGNOSIS — M189 Osteoarthritis of first carpometacarpal joint, unspecified: Secondary | ICD-10-CM | POA: Diagnosis not present

## 2020-12-03 DIAGNOSIS — E041 Nontoxic single thyroid nodule: Secondary | ICD-10-CM | POA: Diagnosis not present

## 2020-12-03 DIAGNOSIS — Z6821 Body mass index (BMI) 21.0-21.9, adult: Secondary | ICD-10-CM | POA: Diagnosis not present

## 2020-12-03 DIAGNOSIS — Z1331 Encounter for screening for depression: Secondary | ICD-10-CM | POA: Diagnosis not present

## 2020-12-03 DIAGNOSIS — J301 Allergic rhinitis due to pollen: Secondary | ICD-10-CM | POA: Diagnosis not present

## 2021-01-27 ENCOUNTER — Other Ambulatory Visit: Payer: Self-pay

## 2021-01-27 ENCOUNTER — Other Ambulatory Visit (HOSPITAL_COMMUNITY): Payer: Self-pay | Admitting: Family Medicine

## 2021-01-27 ENCOUNTER — Ambulatory Visit (HOSPITAL_COMMUNITY)
Admission: RE | Admit: 2021-01-27 | Discharge: 2021-01-27 | Disposition: A | Discharge status: Home / Self Care | Payer: Medicare PPO | Source: Ambulatory Visit | Attending: Family Medicine | Admitting: Family Medicine

## 2021-01-27 DIAGNOSIS — S299XXA Unspecified injury of thorax, initial encounter: Secondary | ICD-10-CM

## 2021-01-27 DIAGNOSIS — J301 Allergic rhinitis due to pollen: Secondary | ICD-10-CM | POA: Diagnosis not present

## 2021-01-27 DIAGNOSIS — M810 Age-related osteoporosis without current pathological fracture: Secondary | ICD-10-CM | POA: Diagnosis not present

## 2021-01-27 DIAGNOSIS — E041 Nontoxic single thyroid nodule: Secondary | ICD-10-CM | POA: Diagnosis not present

## 2021-01-27 DIAGNOSIS — Z9889 Other specified postprocedural states: Secondary | ICD-10-CM | POA: Diagnosis not present

## 2021-01-27 DIAGNOSIS — Z6821 Body mass index (BMI) 21.0-21.9, adult: Secondary | ICD-10-CM | POA: Diagnosis not present

## 2021-02-12 DIAGNOSIS — S0502XA Injury of conjunctiva and corneal abrasion without foreign body, left eye, initial encounter: Secondary | ICD-10-CM | POA: Diagnosis not present

## 2021-02-13 DIAGNOSIS — H5711 Ocular pain, right eye: Secondary | ICD-10-CM | POA: Diagnosis not present

## 2021-02-13 DIAGNOSIS — S0502XD Injury of conjunctiva and corneal abrasion without foreign body, left eye, subsequent encounter: Secondary | ICD-10-CM | POA: Diagnosis not present

## 2021-02-18 DIAGNOSIS — S0502XD Injury of conjunctiva and corneal abrasion without foreign body, left eye, subsequent encounter: Secondary | ICD-10-CM | POA: Diagnosis not present

## 2021-03-03 DIAGNOSIS — L920 Granuloma annulare: Secondary | ICD-10-CM | POA: Diagnosis not present

## 2021-04-30 DIAGNOSIS — Z1231 Encounter for screening mammogram for malignant neoplasm of breast: Secondary | ICD-10-CM | POA: Diagnosis not present

## 2021-04-30 DIAGNOSIS — Z01419 Encounter for gynecological examination (general) (routine) without abnormal findings: Secondary | ICD-10-CM | POA: Diagnosis not present

## 2021-04-30 DIAGNOSIS — Z681 Body mass index (BMI) 19 or less, adult: Secondary | ICD-10-CM | POA: Diagnosis not present

## 2021-05-05 ENCOUNTER — Encounter (INDEPENDENT_AMBULATORY_CARE_PROVIDER_SITE_OTHER): Payer: Self-pay | Admitting: *Deleted

## 2021-05-05 ENCOUNTER — Other Ambulatory Visit (HOSPITAL_COMMUNITY): Payer: Self-pay | Admitting: Family Medicine

## 2021-05-05 DIAGNOSIS — Z6821 Body mass index (BMI) 21.0-21.9, adult: Secondary | ICD-10-CM | POA: Diagnosis not present

## 2021-05-05 DIAGNOSIS — R1013 Epigastric pain: Secondary | ICD-10-CM

## 2021-05-12 ENCOUNTER — Other Ambulatory Visit: Payer: Self-pay

## 2021-05-12 ENCOUNTER — Ambulatory Visit (INDEPENDENT_AMBULATORY_CARE_PROVIDER_SITE_OTHER): Payer: Medicare PPO | Admitting: Gastroenterology

## 2021-05-12 ENCOUNTER — Other Ambulatory Visit: Payer: Self-pay | Admitting: Obstetrics and Gynecology

## 2021-05-12 ENCOUNTER — Encounter (INDEPENDENT_AMBULATORY_CARE_PROVIDER_SITE_OTHER): Payer: Self-pay | Admitting: Gastroenterology

## 2021-05-12 VITALS — BP 127/85 | HR 73 | Temp 98.2°F | Ht 65.0 in | Wt 128.8 lb

## 2021-05-12 DIAGNOSIS — R1111 Vomiting without nausea: Secondary | ICD-10-CM

## 2021-05-12 DIAGNOSIS — R1011 Right upper quadrant pain: Secondary | ICD-10-CM | POA: Diagnosis not present

## 2021-05-12 DIAGNOSIS — Z889 Allergy status to unspecified drugs, medicaments and biological substances status: Secondary | ICD-10-CM | POA: Diagnosis not present

## 2021-05-12 DIAGNOSIS — Z803 Family history of malignant neoplasm of breast: Secondary | ICD-10-CM

## 2021-05-12 DIAGNOSIS — R197 Diarrhea, unspecified: Secondary | ICD-10-CM

## 2021-05-12 NOTE — Patient Instructions (Signed)
It was nice to see you! ?We will check celiac panel and alpha gal to make sure you have no gluten or red meat allergies ?I suspect that symptoms are related to some type of allergic reaction or intolerance ?Please proceed with CT ordered by Dr. Hilma Favors, I will be in touch once labs have resulted. If CT and labs are both normal, we can discuss proceeding with upper endoscopy, though I do not think that symptoms are related to acid reflux or an upper GI source necessarily. ?If you have another episode, please try to write down what you had to eat/drink that day and where you ate, if not at home.  ? ? ?

## 2021-05-12 NOTE — Progress Notes (Signed)
? ?Referring Provider: Sharilyn Sites, MD ?Primary Care Physician:  Sharilyn Sites, MD ?Primary GI Physician: new ? ?Chief Complaint  ?Patient presents with  ? Abdominal Pain  ?  Patient here today with complaints of abdominal pain in the epigastric area. She has episodes of violent diarrhea and vomiting that lasts for hours. She has had her gallbladder removed in 1996.  ? ?HPI:   ?Beth Lynch is a 66 y.o. female with past medical history of thyroid disease.  ? ?Patient presenting today as new patient for RUQ pain and sporadic episodes of diarrhea and vomiting.  ? ?She states for many years she has had these episodes where she starts to suddenly feel unwell, will start having RUQ pain, she denies nausea, but will have multiple episodes of violent vomiting and diarrhea. Symptoms will last for hours and then will suddenly stop, she feels fine the next day. Typically symptoms begin in the evening, usually after a meal, she states that she had her gallbladder removed many years ago, does not notice worsening of these symptoms since cholecystectomy. 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 She had more recent allergy testing with PCP which was negative, she has a known shrimp allergy, though this did not show up on most recent testing, cannot eat sweet potatoes or barbecue as these cause her to have GI upset. Unsure if she has had celiac or alpha gal testing in the past though allergy testing from PCP does not appear to have included these. She reports symptoms occurred maybe once every few years initially, recently she has had 3 episodes over the past 4 months. Previously on prilosec and prevacid, could not tolerate prilosec, but symptoms seemed to calm down some on the prevacid, years back. She eventually came off of PPI as she had no GERD symptoms. She denies any epigastric pain or post prandial abdominal pain.  Has very occasional heartburn at night, rarely ever has any during the day time. Has very occasional feeling of food stuck at base of sternum. Recently started on Prevacid by PCP, also advised to start align but she has held off on this due to her sensitivities/allergies, as she did not want to start two new things at once. Has a BM daily at baseline without constipation or diarrhea. Denies blood in stools or black stools. Denies weight loss, early satiety, changes in appetite.Reubin Milan seem to pinpoint any specific triggers of her symptoms, especially since they occur so sporadically. She denies any throat, tongue or lip swelling, no wheezing, urticaria or sob occurs when she has these episodes of violent vomiting and diarrhea.  ? ?NSAID [id number]  ?Social hx: occasional glass of wine ?Fam hx:no CRC or liver disease ? ?Last Colonoscopy:04/28/17- Diverticulosis in the sigmoid colon. ?- External hemorrhoids. ?- No specimens collected. ?Last Endoscopy:never ? ?Recommendations:  ?Repeat in march 2029 ? ?Past Medical History:  ?Diagnosis Date  ? Multiple thyroid nodules   ? Thyroid disease   ? Wears glasses   ? ? ?Past Surgical History:  ?Procedure Laterality Date  ? BACK SURGERY  2014  ? lumb fusion  ? CARPOMETACARPAL (CMC) FUSION OF THUMB Left 12/28/2018  ? Procedure: FUSION METACARPAL PHANLAGEAL LEFT THUMB;  Surgeon: Leanora Cover, MD;  Location: [place];  Service: Orthopedics;  Laterality: Left;  AXILLARY BLOCK  ? CARPOMETACARPEL SUSPENSION PLASTY Left 03/17/2017  ? Procedure: SUSPENSIONPLASTY LEFT THUMB PARTIAL TRAPEZOID EXCISION  ABDUCTOR POLLICIS LONGUS TRANSFER TRAPAZIUM EXCISION;  Surgeon: Daryll Brod, MD;  Location: [place];  Service: Orthopedics;  Laterality: Left;  AXILLARY BLOCK  ? CHOLECYSTECTOMY    ? COLONOSCOPY    ? COLONOSCOPY N/A 04/28/2017  ? Procedure: COLONOSCOPY;  Surgeon: Rogene Houston, MD;  [facility];  Service: Endoscopy;  Laterality: N/A;  730  ? LIPOMA  EXCISION    ? shoulder  ? TONSILLECTOMY    ? TRIGGER FINGER RELEASE Right 12/27/2013  ? Procedure: RELEASE TRIGGER FINGER/A-1 PULLEY RIGHT MIDDLE FINGER;  Surgeon: Daryll Brod, MD;  Location: [place];  Service: Orthopedics;  Laterality: Right;  ? TRIGGER FINGER RELEASE Left 02/17/2018  ? Procedure: RELEASE TRIGGER FINGER/A-1 PULLEY LEFT MIDDLE FINGER;  Surgeon: Daryll Brod, MD;  Location: [place];  Service: Orthopedics;  Laterality: Left;  ? TUBAL LIGATION    ? ? ?Current Outpatient Medications  ?Medication Sig Dispense Refill  ? acetaminophen (TYLENOL) 500 MG tablet Take 1,000 mg by mouth every 6 (six) hours as needed.    ? cholecalciferol (VITAMIN D) 1000 units tablet Take 1,000 Units by mouth at bedtime.     ? fluticasone (FLONASE) 50 MCG/ACT nasal spray Place 1 spray into both nostrils as needed for allergies or rhinitis.    ? lansoprazole (PREVACID) 30 MG capsule Take 30 mg by mouth daily at 12 noon.    ? levocetirizine (XYZAL) 5 MG tablet Take 5 mg by mouth at bedtime.     ? metroNIDAZOLE (METROCREAM) 0.75 % cream Apply 1 application. topically as needed.    ? ?No current facility-administered medications for this visit.  ? ? ?Allergies as of 05/12/2021 - Review Complete 05/12/2021  ?Allergen Reaction Noted  ? Shellfish allergy Anaphylaxis 12/25/2013  ? Alendronate sodium Hives 05/19/2010  ? Calcium-containing compounds Hives 05/19/2010  ? Codeine Hives 12/25/2013  ? Imitrex [sumatriptan] Other (See Comments) 03/04/2012  ? Latex Hives 12/25/2013  ? Nsaids Hives 12/25/2013  ? Betadine [povidone iodine] Rash 12/25/2013  ? Hydrocodone Hives, Itching, and Rash 12/27/2013  ? Oxycodone Hives, Itching, and Rash 12/27/2013  ? Tetracycline Rash   ? ? ?Family History  ?Problem Relation Age of Onset  ? Breast cancer Mother   ? Breast cancer Maternal Aunt   ? Breast cancer Maternal Grandmother   ? Colon cancer Neg Hx   ? ? ?Social History  ? ?Socioeconomic History  ? Marital status:  Married  ?  Spouse name: Not on file  ? Number of children: Not on file  ? Years of education: Not on file  ? Highest education level: Not on file  ?Occupational History  ? Not on file  ?Tobacco Use  ? Smoking status: Never  ? Smokeless tobacco: Never  ?Vaping Use  ? Vaping Use: Never used  ?Substance and Sexual Activity  ? Alcohol use: Yes  ?  Comment: rare  ? Drug use: No  ? Sexual activity: Never  ?Other Topics Concern  ? Not on file  ?Social History Narrative  ? Not on file  ? ?Social Determinants of Health  ? ?Financial Resource Strain: Not on file  ?Food Insecurity: Not on file  ?Transportation Needs: Not on file  ?Physical Activity: Not on file  ?Stress: Not on file  ?Social Connections: Not on file  ? ?Review of systems ?General: negative for malaise, night sweats, fever, chills, weight loss ?Neck: Negative for lumps, goiter, pain and significant neck swelling ?Resp: Negative for cough, wheezing, dyspnea at  rest ?CV: Negative for chest pain, leg swelling, palpitations, orthopnea ?GI: denies melena, hematochezia, nausea, constipation, dysphagia, odyonophagia, early satiety or unintentional weight loss. +RUQ pain +diarrhea +vomiting ?MSK: Negative for joint pain or swelling, back pain, and muscle pain. ?Derm: Negative for itching or rash ?Psych: Denies depression, anxiety, memory loss, confusion. No homicidal or suicidal ideation.  ?Heme: Negative for prolonged bleeding, bruising easily, and swollen nodes. ?Endocrine: Negative for cold or heat intolerance, polyuria, polydipsia and goiter. ?Neuro: negative for tremor, gait imbalance, syncope and seizures. ?The remainder of the review of systems is noncontributory. ? ?Physical Exam: ?BP 127/85 (BP Location: Left Arm, Patient Position: Sitting, Cuff Size: Small)   Pulse 73   Temp 98.2 ?F (36.8 ?C) (Oral)   Ht 5\' 5"  (1.651 m)   Wt 128 lb 12.8 oz (58.4 kg)   BMI 21.43 kg/m?  ?General:   Alert and oriented. No distress noted. Pleasant and cooperative.  ?Head:   Normocephalic and atraumatic. ?Eyes:  Conjuctiva clear without scleral icterus. ?Mouth:  Oral mucosa pink and moist. Good dentition. No lesions. ?Heart: Normal rate and rhythm, s1 and s2 heart sounds present.

## 2021-05-15 LAB — ALPHA-GAL PANEL
Allergen, Mutton, f88: <0.1 kU/L
Allergen, Pork, f26: <0.1 kU/L
Beef: <0.1 kU/L
CLASS: 0
CLASS: 0
Class: 0
GALACTOSE-ALPHA-1,3-GALACTOSE IGE*: <0.1 kU/L (ref <0.10)

## 2021-05-15 LAB — CELIAC DISEASE PANEL
(tTG) Ab, IgA: <1 U/mL
(tTG) Ab, IgG: <1 U/mL
Gliadin IgA: <1 U/mL
Gliadin IgG: <1 U/mL
Immunoglobulin A: 77 mg/dL (ref 70–320)

## 2021-05-15 LAB — INTERPRETATION:

## 2021-05-25 DIAGNOSIS — L738 Other specified follicular disorders: Secondary | ICD-10-CM | POA: Diagnosis not present

## 2021-05-25 DIAGNOSIS — D1801 Hemangioma of skin and subcutaneous tissue: Secondary | ICD-10-CM | POA: Diagnosis not present

## 2021-05-25 DIAGNOSIS — I788 Other diseases of capillaries: Secondary | ICD-10-CM | POA: Diagnosis not present

## 2021-05-25 DIAGNOSIS — L821 Other seborrheic keratosis: Secondary | ICD-10-CM | POA: Diagnosis not present

## 2021-05-25 DIAGNOSIS — L920 Granuloma annulare: Secondary | ICD-10-CM | POA: Diagnosis not present

## 2021-05-25 DIAGNOSIS — D225 Melanocytic nevi of trunk: Secondary | ICD-10-CM | POA: Diagnosis not present

## 2021-06-17 ENCOUNTER — Encounter (HOSPITAL_COMMUNITY): Payer: Self-pay | Admitting: Radiology

## 2021-06-17 ENCOUNTER — Ambulatory Visit (HOSPITAL_COMMUNITY)
Admission: RE | Admit: 2021-06-17 | Discharge: 2021-06-17 | Disposition: A | Discharge status: Home / Self Care | Payer: Medicare PPO | Source: Ambulatory Visit | Attending: Family Medicine | Admitting: Family Medicine

## 2021-06-17 DIAGNOSIS — R1013 Epigastric pain: Secondary | ICD-10-CM | POA: Insufficient documentation

## 2021-06-17 DIAGNOSIS — K7689 Other specified diseases of liver: Secondary | ICD-10-CM | POA: Diagnosis not present

## 2021-06-17 DIAGNOSIS — K5730 Diverticulosis of large intestine without perforation or abscess without bleeding: Secondary | ICD-10-CM | POA: Diagnosis not present

## 2021-06-17 LAB — POCT I-STAT CREATININE: Creatinine, Ser: 1.1 mg/dL — ABNORMAL HIGH (ref 0.44–1.00)

## 2021-06-17 MED ORDER — IOHEXOL 300 MG/ML  SOLN
100.0000 mL | Freq: Once | INTRAMUSCULAR | Status: AC | PRN
  Administered 2021-06-17: 100 mL via INTRAVENOUS

## 2021-06-22 ENCOUNTER — Ambulatory Visit (INDEPENDENT_AMBULATORY_CARE_PROVIDER_SITE_OTHER): Payer: Medicare PPO | Admitting: Gastroenterology

## 2021-06-29 ENCOUNTER — Telehealth (INDEPENDENT_AMBULATORY_CARE_PROVIDER_SITE_OTHER): Payer: Self-pay | Admitting: *Deleted

## 2021-06-29 NOTE — Telephone Encounter (Signed)
Patient left voicemail for chelsea asking if she received CT results and to check to see what next step is. ( CT was ordered by pcp - in epic)  ?

## 2021-06-30 DIAGNOSIS — M159 Polyosteoarthritis, unspecified: Secondary | ICD-10-CM | POA: Diagnosis not present

## 2021-06-30 NOTE — Telephone Encounter (Signed)
Called and discussed with patient and she does want to go ahead with EGD.  Can do anyday from May 22nd through June 1st.  ? ?

## 2021-06-30 NOTE — Telephone Encounter (Signed)
Left message to return call 

## 2021-07-01 ENCOUNTER — Encounter (INDEPENDENT_AMBULATORY_CARE_PROVIDER_SITE_OTHER): Payer: Self-pay

## 2021-07-01 ENCOUNTER — Other Ambulatory Visit (INDEPENDENT_AMBULATORY_CARE_PROVIDER_SITE_OTHER): Payer: Self-pay

## 2021-07-01 DIAGNOSIS — R1111 Vomiting without nausea: Secondary | ICD-10-CM

## 2021-07-01 DIAGNOSIS — R1011 Right upper quadrant pain: Secondary | ICD-10-CM

## 2021-07-16 ENCOUNTER — Encounter (HOSPITAL_COMMUNITY)
Admission: RE | Admit: 2021-07-16 | Discharge: 2021-07-16 | Disposition: A | Discharge status: Home / Self Care | Payer: Medicare PPO | Source: Ambulatory Visit | Attending: Gastroenterology | Admitting: Gastroenterology

## 2021-07-21 ENCOUNTER — Encounter (HOSPITAL_COMMUNITY): Payer: Self-pay | Admitting: Gastroenterology

## 2021-07-21 ENCOUNTER — Ambulatory Visit (HOSPITAL_COMMUNITY): Payer: Medicare PPO | Admitting: Anesthesiology

## 2021-07-21 ENCOUNTER — Encounter (HOSPITAL_COMMUNITY)
Admission: RE | Disposition: A | Discharge status: Home / Self Care | Payer: Self-pay | Source: Home / Self Care | Attending: Gastroenterology

## 2021-07-21 ENCOUNTER — Ambulatory Visit (HOSPITAL_COMMUNITY)
Admission: RE | Admit: 2021-07-21 | Discharge: 2021-07-21 | Disposition: A | Discharge status: Home / Self Care | Payer: Medicare PPO | Attending: Gastroenterology | Admitting: Gastroenterology

## 2021-07-21 ENCOUNTER — Ambulatory Visit (HOSPITAL_BASED_OUTPATIENT_CLINIC_OR_DEPARTMENT_OTHER): Payer: Medicare PPO | Admitting: Anesthesiology

## 2021-07-21 DIAGNOSIS — K5710 Diverticulosis of small intestine without perforation or abscess without bleeding: Secondary | ICD-10-CM

## 2021-07-21 DIAGNOSIS — R1110 Vomiting, unspecified: Secondary | ICD-10-CM | POA: Diagnosis not present

## 2021-07-21 DIAGNOSIS — R197 Diarrhea, unspecified: Secondary | ICD-10-CM | POA: Insufficient documentation

## 2021-07-21 DIAGNOSIS — K209 Esophagitis, unspecified without bleeding: Secondary | ICD-10-CM | POA: Insufficient documentation

## 2021-07-21 DIAGNOSIS — R1011 Right upper quadrant pain: Secondary | ICD-10-CM | POA: Insufficient documentation

## 2021-07-21 DIAGNOSIS — R110 Nausea: Secondary | ICD-10-CM | POA: Diagnosis not present

## 2021-07-21 DIAGNOSIS — K2100 Gastro-esophageal reflux disease with esophagitis, without bleeding: Secondary | ICD-10-CM | POA: Diagnosis not present

## 2021-07-21 DIAGNOSIS — R1111 Vomiting without nausea: Secondary | ICD-10-CM

## 2021-07-21 HISTORY — PX: ESOPHAGOGASTRODUODENOSCOPY (EGD) WITH PROPOFOL: SHX5813

## 2021-07-21 HISTORY — PX: BIOPSY: SHX5522

## 2021-07-21 SURGERY — ESOPHAGOGASTRODUODENOSCOPY (EGD) WITH PROPOFOL
Anesthesia: General

## 2021-07-21 MED ORDER — ONDANSETRON 4 MG PO TBDP: 4.0000 mg | ORAL_TABLET | Freq: Three times a day (TID) | ORAL | 1 refills | Status: DC | PRN

## 2021-07-21 MED ORDER — LACTATED RINGERS IV SOLN
INTRAVENOUS | Status: DC
Start: 2021-07-21 — End: 2021-07-21
  Administered 2021-07-21: 1000 mL via INTRAVENOUS

## 2021-07-21 MED ORDER — PROPOFOL 10 MG/ML IV BOLUS
INTRAVENOUS | Status: DC | PRN
  Administered 2021-07-21: 20 mg via INTRAVENOUS
  Administered 2021-07-21: 50 mg via INTRAVENOUS
  Administered 2021-07-21: 20 mg via INTRAVENOUS
  Administered 2021-07-21: 100 mg via INTRAVENOUS

## 2021-07-21 MED ORDER — PHENYLEPHRINE HCL (PRESSORS) 10 MG/ML IV SOLN: INTRAVENOUS | Status: DC | PRN

## 2021-07-21 MED ORDER — LIDOCAINE HCL (CARDIAC) PF 50 MG/5ML IV SOSY
PREFILLED_SYRINGE | INTRAVENOUS | Status: DC | PRN
  Administered 2021-07-21: 100 mg via INTRAVENOUS

## 2021-07-21 NOTE — H&P (Signed)
Beth Lynch is an 66 y.o. female.   Chief Complaint: Nausea, vomiting episodes of diarrhea. HPI: 66 year old female who comes today for evaluation of episodes of nausea, vomiting and diarrhea.  Patient has presented intermittent episodes of vomiting and episodes of diarrhea that last 3 to 4 hours for multiple years.  These are usually triggered by intake of shellfish or pork but most recently they have not be related to any specific type of food.  Has presented 3 episodes in the last 6 months.  Negative celiac disease panel and Alpha-gal panel.  She also underwent a CT of the abdomen and pelvis with IV contrast that showed presence of duodenal diverticulosis in the 4 portion of the duodenum.  Past Medical History:  Diagnosis Date   Multiple thyroid nodules    Thyroid disease    Wears glasses     Past Surgical History:  Procedure Laterality Date   BACK SURGERY  2014   lumb fusion   CARPOMETACARPAL (CMC) FUSION OF THUMB Left 12/28/2018   Procedure: FUSION METACARPAL PHANLAGEAL LEFT THUMB;  Surgeon: Betha Loa, MD;  Location: [facility];  Service: Orthopedics;  Laterality: Left;  AXILLARY BLOCK   CARPOMETACARPEL SUSPENSION PLASTY Left 03/17/2017   Procedure: SUSPENSIONPLASTY LEFT THUMB PARTIAL TRAPEZOID EXCISION ABDUCTOR POLLICIS LONGUS TRANSFER TRAPAZIUM EXCISION;  Surgeon: Cindee Salt, MD;  Location: [facility];  Service: Orthopedics;  Laterality: Left;  AXILLARY BLOCK   CHOLECYSTECTOMY     COLONOSCOPY     COLONOSCOPY N/A 04/28/2017   Procedure: COLONOSCOPY;  Surgeon: Malissa Hippo, MD;  [facility];  Service: Endoscopy;  Laterality: N/A;  730   LIPOMA EXCISION     shoulder   TONSILLECTOMY     TRIGGER FINGER RELEASE Right 12/27/2013   Procedure: RELEASE TRIGGER FINGER/A-1 PULLEY RIGHT MIDDLE FINGER;  Surgeon: Cindee Salt, MD;  Location: [facility];  Service: Orthopedics;  Laterality: Right;   TRIGGER FINGER RELEASE Left  02/17/2018   Procedure: RELEASE TRIGGER FINGER/A-1 PULLEY LEFT MIDDLE FINGER;  Surgeon: Cindee Salt, MD;  Location: [facility];  Service: Orthopedics;  Laterality: Left;   TUBAL LIGATION      Family History  Problem Relation Age of Onset   Breast cancer Mother    Breast cancer Maternal Aunt    Breast cancer Maternal Grandmother    Colon cancer Neg Hx    Social History:  reports that she has never smoked. She has never used smokeless tobacco. She reports current alcohol use. She reports that she does not use drugs.  Allergies:  Allergies  Allergen Reactions   Denosumab Shortness Of Breath, Itching and Rash    Chills, fever, headaches, GI upset, fatigue, Congestion, muscle pain, joint pain, bone pain, back pain, TMJ, vertigo   Shellfish Allergy Anaphylaxis   Alendronate Sodium Hives   Calcium-Containing Compounds Hives    No reaction to LR in the past per pt 02/07/18   Codeine Hives   Imitrex [Sumatriptan] Other (See Comments)    Breast lump   Latex Hives   Nsaids Hives   Betadine [Povidone Iodine] Rash   Hydrocodone Hives, Itching and Rash   Oxycodone Hives, Itching and Rash   Tetracycline Rash    Medications Prior to Admission  Medication Sig Dispense Refill   acidophilus (RISAQUAD) CAPS capsule Take 1 capsule by mouth daily.     Cholecalciferol (VITAMIN D) 50 MCG (2000 UT) tablet Take 2,000 Units by mouth at bedtime.     fluticasone (  FLONASE) 50 MCG/ACT nasal spray Place 1 spray into both nostrils as needed for allergies or rhinitis.     levocetirizine (XYZAL) 5 MG tablet Take 5 mg by mouth at bedtime.      metroNIDAZOLE (METROCREAM) 0.75 % cream Apply 1 application. topically daily as needed (rosacea).     polyvinyl alcohol (LIQUIFILM TEARS) 1.4 % ophthalmic solution Place 1 drop into both eyes as needed for dry eyes.      No results found for this or any previous visit (from the past 48 hour(s)). No results found.  Review of Systems  Constitutional:  Negative.   HENT: Negative.    Eyes: Negative.   Respiratory: Negative.    Cardiovascular: Negative.   Gastrointestinal:  Positive for diarrhea, nausea and vomiting.  Endocrine: Negative.   Genitourinary: Negative.   Musculoskeletal: Negative.   Skin: Negative.   Allergic/Immunologic: Negative.   Neurological: Negative.   Hematological: Negative.   Psychiatric/Behavioral: Negative.     Blood pressure 130/73, pulse 64, temperature (!) 97.5 F (36.4 C), temperature source Oral, resp. rate 14, height 5\' 5"  (1.651 m), weight 58.4 kg, SpO2 100 %. Physical Exam  GENERAL: The patient is AO x3, in no acute distress. HEENT: Head is normocephalic and atraumatic. EOMI are intact. Mouth is well hydrated and without lesions. NECK: Supple. No masses LUNGS: Clear to auscultation. No presence of rhonchi/wheezing/rales. Adequate chest expansion HEART: RRR, normal s1 and s2. ABDOMEN: Soft, nontender, no guarding, no peritoneal signs, and nondistended. BS +. No masses. EXTREMITIES: Without any cyanosis, clubbing, rash, lesions or edema. NEUROLOGIC: AOx3, no focal motor deficit. SKIN: no jaundice, no rashes  Assessment/Plan  66 year old female who comes today for evaluation of episodes of nausea, vomiting and diarrhea.  We will proceed with EGD.  79, MD 07/21/2021, 2:44 PM

## 2021-07-21 NOTE — Anesthesia Preprocedure Evaluation (Signed)

## 2021-07-21 NOTE — Anesthesia Procedure Notes (Signed)
Date/Time: 07/21/2021 2:51 PM Performed by: Franco Nones, CRNA Pre-anesthesia Checklist: Patient identified, Emergency Drugs available, Suction available, Timeout performed and Patient being monitored Patient Re-evaluated:Patient Re-evaluated prior to induction Oxygen Delivery Method: Nasal Cannula

## 2021-07-21 NOTE — Transfer of Care (Signed)
Immediate Anesthesia Transfer of Care Note  Patient: Shalee Rostami  Procedure(s) Performed: ESOPHAGOGASTRODUODENOSCOPY (EGD) WITH PROPOFOL BIOPSY  Patient Location: Short Stay  Anesthesia Type:General  Level of Consciousness: awake and patient cooperative  Airway & Oxygen Therapy: Patient Spontanous Breathing  Post-op Assessment: Report given to RN and Post -op Vital signs reviewed and stable  Post vital signs: Reviewed and stable  Last Vitals:  Vitals Value Taken Time  BP 91/57 07/21/21 1507  Temp 36.6 C 07/21/21 1507  Pulse 79 07/21/21 1507  Resp 16   SpO2 94 % 07/21/21 1507    Last Pain:  Vitals:   07/21/21 1507  TempSrc: Oral  PainSc: 0-No pain      Patients Stated Pain Goal: 8 ([id number])  Complications: No notable events documented.

## 2021-07-21 NOTE — Discharge Instructions (Addendum)
-   Discharge patient to home (ambulatory).  - Resume previous diet.  - Await pathology results.  - Can take Zofran 4 mg sublingual as needed if presence of recurrent vomiting.

## 2021-07-21 NOTE — Op Note (Signed)
[facility] Patient Name: Beth MedinaJane Lynch Procedure Date: 07/21/2021 2:48 PM MRN: [id number] Date of Birth: 05/17/1955 Attending MD: Katrinka Blazinganiel Castaneda ,  CSN: [id number] Age: [age] Admit Type: Outpatient Procedure:                Upper GI endoscopy Indications:              Abdominal pain in the right upper quadrant,                            Diarrhea, Vomiting Providers:                Katrinka Blazinganiel Castaneda, Buel ReamAngela A. Thomasena Edisollins RN, RN, Cyril MourningJay                            Christopher Tech, Technician Referring MD:              Medicines:                Monitored Anesthesia Care Complications:            No immediate complications. Estimated Blood Loss:     Estimated blood loss: none. Procedure:                Pre-Anesthesia Assessment:                           - Prior to the procedure, a History and Physical                            was performed, and patient medications, allergies                            and sensitivities were reviewed. The patient's                            tolerance of previous anesthesia was reviewed.                           - The risks and benefits of the procedure and the                            sedation options and risks were discussed with the                            patient. All questions were answered and informed                            consent was obtained.                           - ASA Grade Assessment: II - A patient with mild                            systemic disease.                           After obtaining informed consent, the endoscope was  passed under direct vision. Throughout the                            procedure, the patient's blood pressure, pulse, and                            oxygen saturations were monitored continuously. The                            [id number]-[id number] ([id number]) scope was introduced through the                            mouth, and advanced to the fourth part of duodenum.                             The upper GI endoscopy was accomplished without                            difficulty. The patient tolerated the procedure                            well. Scope In: 2:57:17 PM Scope Out: 3:04:39 PM Total Procedure Duration: 0 hours 7 minutes 22 seconds  Findings:      LA Grade A (one or more mucosal breaks less than 5 mm, not extending       between tops of 2 mucosal folds) esophagitis with no bleeding was found       at the gastroesophageal junction.      The entire examined stomach was normal.      A large non-bleeding diverticulum was found in the third portion of the       duodenum.      The exam of the duodenum was otherwise normal. Impression:               - LA Grade A esophagitis with no bleeding.                           - Normal stomach.                           - Non-bleeding duodenal diverticulum.                           - No specimens collected. Moderate Sedation:      Per Anesthesia Care Recommendation:           - Discharge patient to home (ambulatory).                           - Resume previous diet.                           - Await pathology results.                           - Can take Zofran 4 mg sublingual as needed if  presence of recurrent vomiting. Procedure Code(s):        --- Professional ---                           [phone], Esophagogastroduodenoscopy, flexible,                            transoral; diagnostic, including collection of                            specimen(s) by brushing or washing, when performed                            (separate procedure) Diagnosis Code(s):        --- Professional ---                           K20.90, Esophagitis, unspecified without bleeding                           R10.11, Right upper quadrant pain                           R19.7, Diarrhea, unspecified                           R11.10, Vomiting, unspecified                           K57.10, Diverticulosis of small intestine  without                            perforation or abscess without bleeding CPT copyright 2019 [employer]. All rights reserved. The codes documented in this report are preliminary and upon coder review may  be revised to meet current compliance requirements. Katrinka Blazing, MD Katrinka Blazing,  07/21/2021 3:15:44 PM This report has been signed electronically. Number of Addenda: 1 Addendum Number: 1   Addendum Date: 07/23/2021 5:01:22 PM      Biopsies were taken from the small bowel with cold forceps. Katrinka Blazing, MD Katrinka Blazing,  07/23/2021 5:01:39 PM This report has been signed electronically.

## 2021-07-22 NOTE — Anesthesia Postprocedure Evaluation (Signed)
Anesthesia Post Note  Patient: Beth Lynch  Procedure(s) Performed: ESOPHAGOGASTRODUODENOSCOPY (EGD) WITH PROPOFOL BIOPSY  Patient location during evaluation: Phase II Anesthesia Type: General Level of consciousness: awake Pain management: pain level controlled Vital Signs Assessment: post-procedure vital signs reviewed and stable Respiratory status: spontaneous breathing and respiratory function stable Cardiovascular status: blood pressure returned to baseline and stable Postop Assessment: no headache and no apparent nausea or vomiting Anesthetic complications: no Comments: Late entry   No notable events documented.   Last Vitals:  Vitals:   07/21/21 1324 07/21/21 1507  BP: 130/73 (!) 91/57  Pulse: 64 79  Resp: 14   Temp: (!) 36.4 C 36.6 C  SpO2: 100% 94%    Last Pain:  Vitals:   07/22/21 0910  TempSrc:   PainSc: [place]

## 2021-07-23 LAB — SURGICAL PATHOLOGY

## 2021-07-27 ENCOUNTER — Encounter (HOSPITAL_COMMUNITY): Payer: Self-pay | Admitting: Gastroenterology

## 2022-01-19 DIAGNOSIS — Z6821 Body mass index (BMI) 21.0-21.9, adult: Secondary | ICD-10-CM | POA: Diagnosis not present

## 2022-01-19 DIAGNOSIS — E041 Nontoxic single thyroid nodule: Secondary | ICD-10-CM | POA: Diagnosis not present

## 2022-01-19 DIAGNOSIS — M810 Age-related osteoporosis without current pathological fracture: Secondary | ICD-10-CM | POA: Diagnosis not present

## 2022-01-19 DIAGNOSIS — Z1331 Encounter for screening for depression: Secondary | ICD-10-CM | POA: Diagnosis not present

## 2022-01-19 DIAGNOSIS — M159 Polyosteoarthritis, unspecified: Secondary | ICD-10-CM | POA: Diagnosis not present

## 2022-01-19 DIAGNOSIS — Z0001 Encounter for general adult medical examination with abnormal findings: Secondary | ICD-10-CM | POA: Diagnosis not present

## 2022-01-22 DIAGNOSIS — Z0001 Encounter for general adult medical examination with abnormal findings: Secondary | ICD-10-CM | POA: Diagnosis not present

## 2022-01-22 DIAGNOSIS — Z13220 Encounter for screening for lipoid disorders: Secondary | ICD-10-CM | POA: Diagnosis not present

## 2022-01-22 DIAGNOSIS — E782 Mixed hyperlipidemia: Secondary | ICD-10-CM | POA: Diagnosis not present

## 2022-01-22 DIAGNOSIS — E559 Vitamin D deficiency, unspecified: Secondary | ICD-10-CM | POA: Diagnosis not present

## 2022-01-22 DIAGNOSIS — E039 Hypothyroidism, unspecified: Secondary | ICD-10-CM | POA: Diagnosis not present

## 2022-01-22 DIAGNOSIS — H2513 Age-related nuclear cataract, bilateral: Secondary | ICD-10-CM | POA: Diagnosis not present

## 2022-01-22 DIAGNOSIS — H5213 Myopia, bilateral: Secondary | ICD-10-CM | POA: Diagnosis not present

## 2022-01-22 DIAGNOSIS — M159 Polyosteoarthritis, unspecified: Secondary | ICD-10-CM | POA: Diagnosis not present

## 2022-02-17 DIAGNOSIS — H16101 Unspecified superficial keratitis, right eye: Secondary | ICD-10-CM | POA: Diagnosis not present

## 2022-04-01 DIAGNOSIS — M65332 Trigger finger, left middle finger: Secondary | ICD-10-CM | POA: Diagnosis not present

## 2022-04-01 DIAGNOSIS — M13842 Other specified arthritis, left hand: Secondary | ICD-10-CM | POA: Diagnosis not present

## 2022-05-03 DIAGNOSIS — N958 Other specified menopausal and perimenopausal disorders: Secondary | ICD-10-CM | POA: Diagnosis not present

## 2022-05-03 DIAGNOSIS — Z6820 Body mass index (BMI) 20.0-20.9, adult: Secondary | ICD-10-CM | POA: Diagnosis not present

## 2022-05-03 DIAGNOSIS — M816 Localized osteoporosis [Lequesne]: Secondary | ICD-10-CM | POA: Diagnosis not present

## 2022-05-03 DIAGNOSIS — Z124 Encounter for screening for malignant neoplasm of cervix: Secondary | ICD-10-CM | POA: Diagnosis not present

## 2022-05-03 DIAGNOSIS — Z803 Family history of malignant neoplasm of breast: Secondary | ICD-10-CM | POA: Diagnosis not present

## 2022-05-03 DIAGNOSIS — R29890 Loss of height: Secondary | ICD-10-CM | POA: Diagnosis not present

## 2022-05-03 DIAGNOSIS — Z1231 Encounter for screening mammogram for malignant neoplasm of breast: Secondary | ICD-10-CM | POA: Diagnosis not present

## 2022-05-03 DIAGNOSIS — Z8262 Family history of osteoporosis: Secondary | ICD-10-CM | POA: Diagnosis not present

## 2022-05-04 ENCOUNTER — Other Ambulatory Visit: Payer: Self-pay | Admitting: Obstetrics and Gynecology

## 2022-05-04 DIAGNOSIS — Z803 Family history of malignant neoplasm of breast: Secondary | ICD-10-CM

## 2022-05-26 ENCOUNTER — Ambulatory Visit
Admission: RE | Admit: 2022-05-26 | Discharge: 2022-05-26 | Disposition: A | Discharge status: Home / Self Care | Payer: Medicare PPO | Source: Ambulatory Visit | Attending: Obstetrics and Gynecology | Admitting: Obstetrics and Gynecology

## 2022-05-26 DIAGNOSIS — Z803 Family history of malignant neoplasm of breast: Secondary | ICD-10-CM

## 2022-05-26 MED ORDER — GADOPICLENOL 0.5 MMOL/ML IV SOLN
6.0000 mL | Freq: Once | INTRAVENOUS | Status: AC | PRN
  Administered 2022-05-26: 6 mL via INTRAVENOUS

## 2022-07-16 DIAGNOSIS — L821 Other seborrheic keratosis: Secondary | ICD-10-CM | POA: Diagnosis not present

## 2022-07-16 DIAGNOSIS — L570 Actinic keratosis: Secondary | ICD-10-CM | POA: Diagnosis not present

## 2022-07-16 DIAGNOSIS — D485 Neoplasm of uncertain behavior of skin: Secondary | ICD-10-CM | POA: Diagnosis not present

## 2022-07-16 DIAGNOSIS — D225 Melanocytic nevi of trunk: Secondary | ICD-10-CM | POA: Diagnosis not present

## 2022-07-16 DIAGNOSIS — L820 Inflamed seborrheic keratosis: Secondary | ICD-10-CM | POA: Diagnosis not present

## 2022-07-16 DIAGNOSIS — D234 Other benign neoplasm of skin of scalp and neck: Secondary | ICD-10-CM | POA: Diagnosis not present

## 2022-07-16 DIAGNOSIS — L738 Other specified follicular disorders: Secondary | ICD-10-CM | POA: Diagnosis not present

## 2022-07-16 DIAGNOSIS — L920 Granuloma annulare: Secondary | ICD-10-CM | POA: Diagnosis not present

## 2022-09-22 ENCOUNTER — Other Ambulatory Visit (HOSPITAL_COMMUNITY): Payer: Self-pay | Admitting: Family Medicine

## 2022-09-22 DIAGNOSIS — E041 Nontoxic single thyroid nodule: Secondary | ICD-10-CM | POA: Diagnosis not present

## 2022-09-22 DIAGNOSIS — J385 Laryngeal spasm: Secondary | ICD-10-CM

## 2022-09-22 DIAGNOSIS — T781XXA Other adverse food reactions, not elsewhere classified, initial encounter: Secondary | ICD-10-CM | POA: Diagnosis not present

## 2022-09-22 DIAGNOSIS — J301 Allergic rhinitis due to pollen: Secondary | ICD-10-CM | POA: Diagnosis not present

## 2022-09-22 DIAGNOSIS — Z6821 Body mass index (BMI) 21.0-21.9, adult: Secondary | ICD-10-CM | POA: Diagnosis not present

## 2022-09-30 ENCOUNTER — Ambulatory Visit (HOSPITAL_COMMUNITY): Payer: Medicare PPO

## 2022-10-13 ENCOUNTER — Ambulatory Visit (HOSPITAL_COMMUNITY)
Admission: RE | Admit: 2022-10-13 | Discharge: 2022-10-13 | Disposition: A | Discharge status: Home / Self Care | Payer: Medicare PPO | Source: Ambulatory Visit | Attending: Family Medicine | Admitting: Family Medicine

## 2022-10-13 DIAGNOSIS — E041 Nontoxic single thyroid nodule: Secondary | ICD-10-CM | POA: Insufficient documentation

## 2022-10-13 DIAGNOSIS — J385 Laryngeal spasm: Secondary | ICD-10-CM | POA: Insufficient documentation

## 2022-10-18 DIAGNOSIS — J385 Laryngeal spasm: Secondary | ICD-10-CM | POA: Diagnosis not present

## 2022-10-18 DIAGNOSIS — J383 Other diseases of vocal cords: Secondary | ICD-10-CM | POA: Diagnosis not present

## 2022-10-28 ENCOUNTER — Ambulatory Visit (HOSPITAL_COMMUNITY)
Admission: RE | Admit: 2022-10-28 | Discharge: 2022-10-28 | Disposition: A | Discharge status: Home / Self Care | Payer: Medicare PPO | Source: Ambulatory Visit | Attending: Gastroenterology | Admitting: Gastroenterology

## 2022-10-28 ENCOUNTER — Ambulatory Visit (INDEPENDENT_AMBULATORY_CARE_PROVIDER_SITE_OTHER): Payer: Medicare PPO | Admitting: Gastroenterology

## 2022-10-28 ENCOUNTER — Encounter (INDEPENDENT_AMBULATORY_CARE_PROVIDER_SITE_OTHER): Payer: Self-pay | Admitting: Gastroenterology

## 2022-10-28 VITALS — BP 126/88 | HR 80 | Temp 97.8°F | Ht 65.0 in | Wt 129.6 lb

## 2022-10-28 DIAGNOSIS — R1032 Left lower quadrant pain: Secondary | ICD-10-CM | POA: Insufficient documentation

## 2022-10-28 DIAGNOSIS — K5750 Diverticulosis of both small and large intestine without perforation or abscess without bleeding: Secondary | ICD-10-CM | POA: Diagnosis not present

## 2022-10-28 DIAGNOSIS — N281 Cyst of kidney, acquired: Secondary | ICD-10-CM | POA: Diagnosis not present

## 2022-10-28 DIAGNOSIS — R490 Dysphonia: Secondary | ICD-10-CM | POA: Diagnosis not present

## 2022-10-28 DIAGNOSIS — R932 Abnormal findings on diagnostic imaging of liver and biliary tract: Secondary | ICD-10-CM | POA: Diagnosis not present

## 2022-10-28 DIAGNOSIS — K7689 Other specified diseases of liver: Secondary | ICD-10-CM | POA: Diagnosis not present

## 2022-10-28 MED ORDER — CIPROFLOXACIN HCL 500 MG PO TABS: 500.0000 mg | ORAL_TABLET | Freq: Two times a day (BID) | ORAL | 0 refills | Status: AC

## 2022-10-28 MED ORDER — METRONIDAZOLE 500 MG PO TABS: 500.0000 mg | ORAL_TABLET | Freq: Three times a day (TID) | ORAL | 0 refills | Status: AC

## 2022-10-28 MED ORDER — IOHEXOL 9 MG/ML PO SOLN
ORAL | Status: AC
  Filled 2022-10-28: qty 500

## 2022-10-28 MED ORDER — IOHEXOL 300 MG/ML  SOLN
100.0000 mL | Freq: Once | INTRAMUSCULAR | Status: AC | PRN
  Administered 2022-10-28: 100 mL via INTRAVENOUS

## 2022-10-28 MED ORDER — FAMOTIDINE 20 MG PO TABS: 20.0000 mg | ORAL_TABLET | Freq: Every day | ORAL | 3 refills | Status: DC

## 2022-10-28 NOTE — Progress Notes (Addendum)
Referring Provider: Assunta Found, MD Primary Care Physician:  Assunta Found, MD Primary GI Physician: Dr. Levon Hedger   Chief Complaint  Patient presents with   Gastroesophageal Reflux    Follow up on GERD. States she had to stop ppi due to having osteoporosis last spring. States she was told her hoarseness is related to gerd.    Diverticulitis    Has had fever for about 4 days. Pain on left side for about one week. Thinks she is having a flare of diverticulitis. This is the third time this summer she has had the pain but this time pain has not went away after going on soft diet. Has tried tylenol for pain and it does help.    HPI:   Beth Lynch is a 67 y.o. female with past medical history of thyroid disease, osteoporosis  Patient presenting today for GERD and concerns of diverticulitis  Last seen March 2023, at that time presenting with intermittent episodes of vomiting and diarrhea and GERD  Recommended alpha gal testing, celiac panel, continue with planned CT by PCP, EGD if labs/CT normal.  Present: She notes some LLQ pain and fever for about 1 week. She has had similar symptoms 3 times over the summer, she did more fluids and held off on foods which resolved this previously. She she is having a mix of diarrhea and constipation. No rectal bleeding or melena. She has seen some mucus in stools. She has had a temp as high as 100 at home. No nausea or vomiting.   She has history of issues with sour/acidic foods inducing cough though recently she had more symptoms like laryngospasms where she will cough and be unable to breathe for about 30 seconds. She saw ENT and has a lesion on her larynx she is being sent to [facility] for. She was also told she probably has GERD due to hoarseness she experiences and was advised to take a seaweed substance to help. She noted LLQ pain a few days after starting this so she has since stopped it. Previously on PPI but she was asymptomatic and PPI was  stopped due to history of osteoporosis.   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 They query if her layngospasms are secondary to an allergic reaction.   Does report She has been taking NSAIDs for about 1-2 month for back pain prior to acute LLQ pain.    CT A/P with contrast: April 2023 Mild intra and extrahepatic biliary ductal dilation, stable since prior MRI examination of 06/06/2007 most in keeping with post cholecystectomy change.   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 No superimposed acute inflammatory change.   Severe sigmoid diverticulosis. Last Colonoscopy:04/28/17- Diverticulosis in the sigmoid colon. - External hemorrhoids. - No specimens collected. Last Endoscopy:2023 duodenal diverticula    Recommendations:  Repeat in march 2029   Past Medical History:  Diagnosis Date   Multiple thyroid nodules    Thyroid disease    Wears glasses     Past Surgical History:  Procedure Laterality Date   BACK SURGERY  2014   lumb fusion   BIOPSY  07/21/2021   Procedure: BIOPSY;  Surgeon: Dolores Frame, MD;  [facility];  Service: Gastroenterology;;   CARPOMETACARPAL [facility] FUSION OF THUMB Left 12/28/2018   Procedure: FUSION METACARPAL PHANLAGEAL LEFT THUMB;  Surgeon: Betha Loa, MD;  Location:  [facility];  Service: Orthopedics;  Laterality: Left;  AXILLARY BLOCK   CARPOMETACARPEL SUSPENSION PLASTY Left 03/17/2017   Procedure: SUSPENSIONPLASTY LEFT THUMB PARTIAL TRAPEZOID EXCISION ABDUCTOR POLLICIS LONGUS TRANSFER TRAPAZIUM EXCISION;  Surgeon: Cindee Salt, MD;  Location: [facility];  Service: Orthopedics;  Laterality: Left;  AXILLARY BLOCK   CHOLECYSTECTOMY     COLONOSCOPY     COLONOSCOPY N/A 04/28/2017   Procedure: COLONOSCOPY;  Surgeon: Malissa Hippo, MD;  [facility];   Service: Endoscopy;  Laterality: N/A;  730   ESOPHAGOGASTRODUODENOSCOPY (EGD) WITH PROPOFOL N/A 07/21/2021   Procedure: ESOPHAGOGASTRODUODENOSCOPY (EGD) WITH PROPOFOL;  Surgeon: Dolores Frame, MD;  [facility];  Service: Gastroenterology;  Laterality: N/A;  245 ASA 1   LIPOMA EXCISION     shoulder   TONSILLECTOMY     TRIGGER FINGER RELEASE Right 12/27/2013   Procedure: RELEASE TRIGGER FINGER/A-1 PULLEY RIGHT MIDDLE FINGER;  Surgeon: Cindee Salt, MD;  Location: [facility];  Service: Orthopedics;  Laterality: Right;   TRIGGER FINGER RELEASE Left 02/17/2018   Procedure: RELEASE TRIGGER FINGER/A-1 PULLEY LEFT MIDDLE FINGER;  Surgeon: Cindee Salt, MD;  Location:  SUR[facility]ERY [facility];  Service: Orthopedics;  Laterality: Left;   TUBAL LIGATION      Current Outpatient Medications  Medication Sig Dispense Refill   acetaminophen (TYLENOL) 500 MG tablet Take 500 mg by mouth every 6 (six) hours as needed.     Cholecalciferol (VITAMIN D) 50 MCG (2000 UT) tablet Take 2,000 Units by mouth at bedtime.     fluticasone (FLONASE) 50 MCG/ACT nasal spray Place 1 spray into both nostrils as needed for allergies or rhinitis.     levocetirizine (XYZAL) 5 MG tablet Take 5 mg by mouth at bedtime.      No current facility-administered medications for this visit.    Allergies as of 10/28/2022 - Review Complete 10/28/2022  Allergen Reaction Noted   Denosumab Shortness Of Breath, Itching, and Rash 04/04/2020   Shellfish allergy Anaphylaxis 12/25/2013   Alendronate sodium Hives 05/19/2010   Calcium-containing compounds Hives 05/19/2010   Codeine Hives 12/25/2013   Imitrex [sumatriptan] Other (See Comments) 03/04/2012   Latex Hives 12/25/2013   Nsaids Hives 12/25/2013   Betadine [povidone iodine] Rash 12/25/2013   Hydrocodone Hives, Itching, and Rash 12/27/2013   Oxycodone Hives, Itching, and Rash 12/27/2013   Tetracycline Rash     Family History  Problem Relation Age  of Onset   Breast cancer Mother    Breast cancer Maternal Aunt    Breast cancer Maternal Grandmother    Colon cancer Neg Hx     Social History   Socioeconomic History   Marital status: Married    Spouse name: Not on file   Number of children: Not on file   Years of education: Not on file   Highest education level: Not on file  Occupational History   Not on file  Tobacco Use   Smoking status: Never    Passive exposure: Never   Smokeless tobacco: Never  Vaping Use   Vaping status: Never Used  Substance and Sexual Activity   Alcohol use: Yes    Comment: rare   Drug use: No   Sexual activity: Never  Other Topics Concern   Not on file  Social History Narrative   Not on file   Social Determinants of Health   Financial Resource Strain: Not on file  Food Insecurity: Not on file  Transportation Needs: Not on file  Physical Activity: Not on file  Stress: Not on file  Social Connections: Not on file    Review of systems General: negative for malaise, night sweats, fever, chills, weight loss Neck: Negative for lumps, goiter, pain and significant neck swelling Resp: Negative for cough, wheezing, dyspnea at rest CV: Negative for chest pain, leg swelling, palpitations, orthopnea GI: denies melena, hematochezia, nausea, vomiting, dysphagia, odyonophagia, early satiety or unintentional weight loss. +LLQ pain, +constipation/diarrhea  MSK: Negative for joint pain or swelling, back pain, and muscle pain. Derm: Negative for itching or rash Psych: Denies depression, anxiety, memory loss, confusion. No homicidal or suicidal ideation.  Heme: Negative for prolonged bleeding, bruising easily, and swollen nodes. Endocrine: Negative for cold or heat intolerance, polyuria, polydipsia and goiter. Neuro: negative for tremor, gait imbalance, syncope and seizures. The remainder of the review of systems is noncontributory.  Physical Exam: Ht 5\' 5"  (1.651 m)   Wt 129 lb 9.6 oz (58.8 kg)    BMI 21.57 kg/m  General:   Alert and oriented. No distress noted. Pleasant and cooperative.  Head:  Normocephalic and atraumatic. Eyes:  Conjuctiva clear without scleral icterus. Mouth:  Oral mucosa pink and moist. Good dentition. No lesions. Heart: Normal rate and rhythm, s1 and s2 heart sounds present.  Lungs: Clear lung sounds in all lobes. Respirations equal and unlabored. Abdomen:  +BS, soft, TTP of LLQ, no rebound or guarding. non-distended.  Derm: No palmar erythema or jaundice Msk:  Symmetrical without gross deformities. Normal posture. Extremities:  Without edema. Neurologic:  Alert and  oriented x4 Psych:  Alert and cooperative. Normal mood and affect.  Invalid input(s): "6 MONTHS"   ASSESSMENT: Beth Lynch is a 67 y.o. female presenting today for hoarseness thought secondary to GERD and LLQ pain with associated fever  LLQ pain/Fever: onset 1 week ago, low grade temp of 100 at home. Has tried backing off on foods and doing more liquids, mild improvement in pain but continues to have some. She has tenderness of LLQ on exam. Diverticulosis noted on CT A/P in 2023. No previous diagnosed episodes of diverticulitis. Given clinical presentation with low grade temp and coming up on a weekend, I am going to start empiric antibiotics to treat for diverticulitis. Will obtain stat CT A/P with contrast to confirm.  I discussed with her that if CT does not confirm diverticulitis we can stop antibiotics otherwise she will complete entire course.  If she develops severe pain, vomiting, fever does not respond to Tylenol or rectal bleeding she would need to proceed to the ED for further evaluation.  We also discussed follow-up colonoscopy in 8 to 12 weeks if CT does confirm diverticulitis.  For now she can do soft/bland diet low in fiber and continue to stay well-hydrated, after resolution of her symptoms she can start high-fiber diet in hopes of preventing possible further episodes of  diverticulitis.  She should avoid NSAIDs.  Hoarseness/concern for GERD: History of GERD in the past, she was on PPI previously though she had no symptoms and due to osteoporosis PPI was stopped.  She saw ENT recently and was thought to have potential silent reflux due to her ongoing hoarseness.  She denies any heartburn or acid regurgitation that she is aware of.  Will try low-dose famotidine in the evenings to see if this helps with her hoarseness as this very well could be related to silent reflux.   PLAN:  CT A/P with contrast STAT  2. Cipro 500mg  BID/Flagyl 500mg  TID x10  days (Avoid all ETOH while on flagyl) 3. Utilize tylenol for pain/fevers, avoid NSAIDs 4. Famotidine 20mg  at bedtime 5. Soft/low fiber diet now 6. High fiber diet once symptoms resolved 7. Will need colonoscopy if CT confirms diverticulitis (8-12 weeks) 8.ED precautions   All questions were answered, patient verbalized understanding and is in agreement with plan as outlined above.   Follow Up: 6 weeks   Roanna Reaves L. Jeanmarie Hubert, MSN, APRN, AGNP-C Adult-Gerontology Nurse Practitioner [facility]  I have reviewed the note and agree with the APP's assessment as described in this progress note  If no improvement with famotidine, will need to perform Bravo testing and possible pH impedance testing off medication.  Katrinka Blazing, MD Gastroenterology and Hepatology [facility] Gastroenterology

## 2022-10-28 NOTE — Patient Instructions (Addendum)
We will obtain a CT of your abdomen to confirm if this is diverticulitis.  In the meantime we will start Flagyl 500 mg 3 times a day and Cipro 500 mg twice a day.  Please do not drink alcohol while taking these medications.  Continue to stay well-hydrated, you can use Tylenol for fevers and pain. Please avoid NSAID type medications. Would recommend a soft, low fiber diet at this time.  I will be in touch once I have results of CT scan.  If CT does confirm diverticulitis, we will complete course of antibiotics and will need to discuss scheduling colonoscopy in about 8 to 12 weeks.  If you develop severe pain, rectal bleeding, vomiting, fever that does not respond to Tylenol you will need to proceed to the ED.  I have also sent famotidine 20 mg to take in the evenings before bed to see if this helps with your hoarseness.  Follow up 6 weeks   It was a pleasure to see you today. I want to create trusting relationships with patients and provide genuine, compassionate, and quality care. I truly value your feedback! please be on the lookout for a survey regarding your visit with me today. I appreciate your input about our visit and your time in completing this!    Antwain Caliendo L. Jeanmarie Hubert, MSN, APRN, AGNP-C Adult-Gerontology Nurse Practitioner [facility] Gastroenterology at [facility]

## 2022-10-29 ENCOUNTER — Telehealth (INDEPENDENT_AMBULATORY_CARE_PROVIDER_SITE_OTHER): Payer: Self-pay | Admitting: *Deleted

## 2022-10-29 NOTE — Telephone Encounter (Signed)
[facility] radiology called with Stat results. Results in epic for you to review.

## 2022-11-01 ENCOUNTER — Telehealth (INDEPENDENT_AMBULATORY_CARE_PROVIDER_SITE_OTHER): Payer: Self-pay | Admitting: *Deleted

## 2022-11-01 MED ORDER — FLUCONAZOLE 200 MG PO TABS
200.0000 mg | ORAL_TABLET | Freq: Every day | ORAL | 0 refills | Status: AC
Start: 2022-11-01 — End: 2022-11-15

## 2022-11-01 NOTE — Telephone Encounter (Signed)
Pt started on cipro and flagyl for diverticulitis on 10/28/22. Her tongue is white and showed it to pharmacist and he told her it was thrush. He also told her they did not have liquid rinse for thrush and to ask for the tablets to be sent in. Pt also told me she was having diarrhea about after she takes her morning dose of med but she states she can tolerate that because she wants to stay on this treatment. She will call back if diarrhea worsens. She is requesting a tablet for the thrush to go to [employer].   Pt's number [phone]

## 2022-11-01 NOTE — Telephone Encounter (Signed)
Reviewed allergies listed, ran drug check. Diflucan 200mg  daily for 14 days sent to pharmacy.

## 2022-11-01 NOTE — Telephone Encounter (Signed)
Patient aware med sent to pharmacy

## 2022-12-14 ENCOUNTER — Encounter (INDEPENDENT_AMBULATORY_CARE_PROVIDER_SITE_OTHER): Payer: Self-pay | Admitting: Gastroenterology

## 2022-12-14 ENCOUNTER — Ambulatory Visit (INDEPENDENT_AMBULATORY_CARE_PROVIDER_SITE_OTHER): Payer: Medicare PPO | Admitting: Gastroenterology

## 2022-12-14 ENCOUNTER — Encounter (INDEPENDENT_AMBULATORY_CARE_PROVIDER_SITE_OTHER): Payer: Self-pay

## 2022-12-14 VITALS — BP 121/78 | HR 68 | Temp 97.9°F | Ht 65.0 in | Wt 122.6 lb

## 2022-12-14 DIAGNOSIS — Z8719 Personal history of other diseases of the digestive system: Secondary | ICD-10-CM

## 2022-12-14 DIAGNOSIS — K5732 Diverticulitis of large intestine without perforation or abscess without bleeding: Secondary | ICD-10-CM | POA: Insufficient documentation

## 2022-12-14 DIAGNOSIS — K219 Gastro-esophageal reflux disease without esophagitis: Secondary | ICD-10-CM | POA: Diagnosis not present

## 2022-12-14 MED ORDER — CLENPIQ 10-3.5-12 MG-GM -GM/175ML PO SOLN: 1.0000 | ORAL | 0 refills | Status: DC

## 2022-12-14 NOTE — Progress Notes (Addendum)
Referring Provider: Assunta Found, MD Primary Care Physician:  Assunta Found, MD Primary GI Physician: Dr. Levon Hedger   Chief Complaint  Patient presents with   Diverticulitis    Follow up on diverticulitis. States she is better now. States treatment made her have horrible diarrhea and a lot of pressure in head. If needing further treatment in future would like different meds besides cipro and flagyl.    HPI:   Beth Lynch is a 67 y.o. female with past medical history of  thyroid disease, osteoporosis   Patient presenting today for follow up of diverticulitis and GERD  Seen 9/5 with LLQ pain and fever x1 week. Also with some laryngospasms, hoarseness, not on PPI  Started on cipro 500mg  BID/flagyl 500mg  TID x10 days, famotidine 20mg  at bedtime, low fiber diet, CT A/P with contrast stat  CT A/P 10/28/22  Findings consistent with acute diverticulitis at the distal descending/proximal sigmoid colon. No perforation or abscess. 2. Slight interval enlargement of left extrarenal pelvis without hydroureter or obstructing stone 3. Status post cholecystectomy with stable enlargement of common bile duct likely due to postsurgical change.  Present:  States feeling much better. Took full course of abx which resolved her abdominal pain and bloating. Notes she had a lot of diarrhea and head pressure while on flagyl and cipro, even developed thrush which she took diflucan for. She is following a new diet from a book called "pain free foods" which is a guide on eating for patients with diverticulitis and feels this has improved her digestion tremendously. She is having regular BMs, no rectal bleeding or melena. She notably had been taking aleve for back pain the months leading up to her diverticulitis and queries if this contributed, she is avoiding all NSAIDs at this time.  Hoarseness has improved with the use of famotidine 20mg  at bedtime. She feels this is providing some improvement for her.  She is still trying to avoid spicy foods.    Last Colonoscopy:04/28/17- Diverticulosis in the sigmoid colon. - External hemorrhoids. - No specimens collected. Last Endoscopy:2023 duodenal diverticula    Recommendations:   Past Medical History:  Diagnosis Date   Multiple thyroid nodules    Thyroid disease    Wears glasses     Past Surgical History:  Procedure Laterality Date   BACK SURGERY  2014   lumb fusion   BIOPSY  07/21/2021   Procedure: BIOPSY;  Surgeon: Dolores Frame, MD;  [facility];  Service: Gastroenterology;;   CARPOMETACARPAL [facility]) FUSION OF THUMB Left 12/28/2018   Procedure: FUSION METACARPAL PHANLAGEAL LEFT THUMB;  Surgeon: Betha Loa, MD;  Location: [facility];  Service: Orthopedics;  Laterality: Left;  AXILLARY BLOCK   CARPOMETACARPEL SUSPENSION PLASTY Left 03/17/2017   Procedure: SUSPENSIONPLASTY LEFT THUMB PARTIAL TRAPEZOID EXCISION ABDUCTOR POLLICIS LONGUS TRANSFER TRAPAZIUM EXCISION;  Surgeon: Cindee Salt, MD;  Location: [facility];  Service: Orthopedics;  Laterality: Left;  AXILLARY BLOCK   CHOLECYSTECTOMY     COLONOSCOPY     COLONOSCOPY N/A 04/28/2017   Procedure: COLONOSCOPY;  Surgeon: Malissa Hippo, MD;  [facility];  Service: Endoscopy;  Laterality: N/A;  730   ESOPHAGOGASTRODUODENOSCOPY (EGD) WITH PROPOFOL N/A 07/21/2021   Procedure: ESOPHAGOGASTRODUODENOSCOPY (EGD) WITH PROPOFOL;  Surgeon: Dolores Frame, MD;  [facility];  Service: Gastroenterology;  Laterality: N/A;  245 ASA 1   LIPOMA EXCISION     shoulder   TONSILLECTOMY     TRIGGER FINGER RELEASE Right  12/27/2013   Procedure: RELEASE TRIGGER FINGER/A-1 PULLEY RIGHT MIDDLE FINGER;  Surgeon: Cindee Salt, MD;  Location: [facility];  Service: Orthopedics;  Laterality: Right;   TRIGGER FINGER RELEASE Left 02/17/2018   Procedure: RELEASE TRIGGER FINGER/A-1 PULLEY LEFT MIDDLE FINGER;  Surgeon: Cindee Salt,  MD;  Location: [facility];  Service: Orthopedics;  Laterality: Left;   TUBAL LIGATION      Current Outpatient Medications  Medication Sig Dispense Refill   acetaminophen (TYLENOL) 500 MG tablet Take 500 mg by mouth every 6 (six) hours as needed.     Cholecalciferol (VITAMIN D) 50 MCG (2000 UT) tablet Take 2,000 Units by mouth at bedtime.     famotidine (PEPCID) 20 MG tablet Take 1 tablet (20 mg total) by mouth at bedtime. 30 tablet 3   fluticasone (FLONASE) 50 MCG/ACT nasal spray Place 1 spray into both nostrils as needed for allergies or rhinitis.     levocetirizine (XYZAL) 5 MG tablet Take 5 mg by mouth at bedtime.      No current facility-administered medications for this visit.    Allergies as of 12/14/2022 - Review Complete 12/14/2022  Allergen Reaction Noted   Denosumab Shortness Of Breath, Itching, and Rash 04/04/2020   Shellfish allergy Anaphylaxis 12/25/2013   Alendronate sodium Hives 05/19/2010   Calcium-containing compounds Hives 05/19/2010   Codeine Hives 12/25/2013   Imitrex [sumatriptan] Other (See Comments) 03/04/2012   Latex Hives 12/25/2013   Nsaids Hives 12/25/2013   Betadine [povidone iodine] Rash 12/25/2013   Hydrocodone Hives, Itching, and Rash 12/27/2013   Oxycodone Hives, Itching, and Rash 12/27/2013   Tetracycline Rash     Family History  Problem Relation Age of Onset   Breast cancer Mother    Breast cancer Maternal Aunt    Breast cancer Maternal Grandmother    Colon cancer Neg Hx     Social History   Socioeconomic History   Marital status: Married    Spouse name: Not on file   Number of children: Not on file   Years of education: Not on file   Highest education level: Not on file  Occupational History   Not on file  Tobacco Use   Smoking status: Never    Passive exposure: Never   Smokeless tobacco: Never  Vaping Use   Vaping status: Never Used  Substance and Sexual Activity   Alcohol use: Yes    Comment: rare   Drug  use: No   Sexual activity: Never  Other Topics Concern   Not on file  Social History Narrative   Not on file   Social Determinants of Health   Financial Resource Strain: Not on file  Food Insecurity: Not on file  Transportation Needs: Not on file  Physical Activity: Not on file  Stress: Not on file  Social Connections: Not on file   Review of systems General: negative for malaise, night sweats, fever, chills, weight loss Neck: Negative for lumps, goiter, pain and significant neck swelling Resp: Negative for cough, wheezing, dyspnea at rest CV: Negative for chest pain, leg swelling, palpitations, orthopnea GI: denies melena, hematochezia, nausea, vomiting, diarrhea, constipation, dysphagia, odyonophagia, early satiety or unintentional weight loss.  The remainder of the review of systems is noncontributory.  Physical Exam: BP 121/78 (BP Location: Left Arm, Patient Position: Sitting, Cuff Size: Normal)   Pulse 68   Temp 97.9 F (36.6 C) (Oral)   Ht 5\' 5"  (1.651 m)   Wt 122 lb 9.6 oz (55.6 kg)  BMI 20.40 kg/m  General:   Alert and oriented. No distress noted. Pleasant and cooperative.  Head:  Normocephalic and atraumatic. Eyes:  Conjuctiva clear without scleral icterus. Mouth:  Oral mucosa pink and moist. Good dentition. No lesions. Abdomen:  +BS, soft, non-tender and non-distended. No rebound or guarding. No HSM or masses noted. Neurologic:  Alert and  oriented x4 Psych:  Alert and cooperative. Normal mood and affect.  Invalid input(s): "6 MONTHS"   ASSESSMENT: Dustyn Meredith is a 67 y.o. female presenting today for follow up of diverticulitis and GERD  Diverticulitis: clinically resolved after cipro and flagyl. She did not tolerate cipro and flagyl well, had severe diarrhea, dizziness and headache and developed thrush. While not a true allergy, Would recommend augmentin for treatment of diverticulitis moving forward if she has further episodes. She is following a  guide for pain free foods for patients with diverticulitis and feels well doing this. Will need colonoscopy to follow up episode of diverticulitis as last TCS was in 2019, she would prefer to hold off til early December as her daughter is about to have a baby. She is avoiding NSAIDs and should continue to do so. Indications, risks and benefits of procedure discussed in detail with patient. Patient verbalized understanding and is in agreement to proceed with Colonoscopy in early December.  GERD: hoarseness previously, no real heartburn or indigestion but having some laryngospasms with spicy foods. She feels better on low dose famotidine, hoarseness has improved. Trying to avoid spicy foods. Will continue with H2B for now.    PLAN:  Continue with dietary changes  2. Continue to avoid NSAIDs  3. Schedule colonoscopy in December per pt preference, ASA II  4. Continue famotidine 20mg  at bedtime  All questions were answered, patient verbalized understanding and is in agreement with plan as outlined above.   Follow Up: 1 year   Maely Clements L. Jeanmarie Hubert, MSN, APRN, AGNP-C Adult-Gerontology Nurse Practitioner [facility]  I have reviewed the note and agree with the APP's assessment as described in this progress note  Katrinka Blazing, MD Gastroenterology and Hepatology [facility] Gastroenterology

## 2022-12-14 NOTE — Addendum Note (Signed)
Addended by: Marlowe Shores on: 12/14/2022 09:34 AM   Modules accepted: Orders

## 2022-12-14 NOTE — Patient Instructions (Signed)
I'm so glad you are feeling better! We will get you scheduled for colonoscopy in December Please continue with your current dietary changes and famotidine Let me know if you have any new GI issues  Follow up 1 year  It was a pleasure to see you today. I want to create trusting relationships with patients and provide genuine, compassionate, and quality care. I truly value your feedback! please be on the lookout for a survey regarding your visit with me today. I appreciate your input about our visit and your time in completing this!    Katherine Tout L. Jeanmarie Hubert, MSN, APRN, AGNP-C Adult-Gerontology Nurse Practitioner [facility] Gastroenterology at [facility]

## 2023-01-24 DIAGNOSIS — Z0001 Encounter for general adult medical examination with abnormal findings: Secondary | ICD-10-CM | POA: Diagnosis not present

## 2023-01-24 DIAGNOSIS — Z681 Body mass index (BMI) 19 or less, adult: Secondary | ICD-10-CM | POA: Diagnosis not present

## 2023-01-24 DIAGNOSIS — Z029 Encounter for administrative examinations, unspecified: Secondary | ICD-10-CM | POA: Diagnosis not present

## 2023-01-24 DIAGNOSIS — K5730 Diverticulosis of large intestine without perforation or abscess without bleeding: Secondary | ICD-10-CM | POA: Diagnosis not present

## 2023-01-24 DIAGNOSIS — E041 Nontoxic single thyroid nodule: Secondary | ICD-10-CM | POA: Diagnosis not present

## 2023-01-24 DIAGNOSIS — J385 Laryngeal spasm: Secondary | ICD-10-CM | POA: Diagnosis not present

## 2023-01-24 DIAGNOSIS — J301 Allergic rhinitis due to pollen: Secondary | ICD-10-CM | POA: Diagnosis not present

## 2023-01-24 DIAGNOSIS — Z1331 Encounter for screening for depression: Secondary | ICD-10-CM | POA: Diagnosis not present

## 2023-01-24 DIAGNOSIS — T781XXA Other adverse food reactions, not elsewhere classified, initial encounter: Secondary | ICD-10-CM | POA: Diagnosis not present

## 2023-01-28 ENCOUNTER — Ambulatory Visit (HOSPITAL_COMMUNITY): Payer: Medicare PPO | Admitting: Anesthesiology

## 2023-01-28 ENCOUNTER — Ambulatory Visit (HOSPITAL_COMMUNITY)
Admission: RE | Admit: 2023-01-28 | Discharge: 2023-01-28 | Disposition: A | Discharge status: Home / Self Care | Payer: Medicare PPO | Attending: Gastroenterology | Admitting: Gastroenterology

## 2023-01-28 ENCOUNTER — Other Ambulatory Visit: Payer: Self-pay

## 2023-01-28 ENCOUNTER — Encounter (HOSPITAL_COMMUNITY)
Admission: RE | Disposition: A | Discharge status: Home / Self Care | Payer: Self-pay | Source: Home / Self Care | Attending: Gastroenterology

## 2023-01-28 ENCOUNTER — Encounter (HOSPITAL_COMMUNITY): Payer: Self-pay | Admitting: Gastroenterology

## 2023-01-28 ENCOUNTER — Ambulatory Visit (HOSPITAL_BASED_OUTPATIENT_CLINIC_OR_DEPARTMENT_OTHER): Payer: Medicare PPO | Admitting: Anesthesiology

## 2023-01-28 DIAGNOSIS — K219 Gastro-esophageal reflux disease without esophagitis: Secondary | ICD-10-CM | POA: Diagnosis not present

## 2023-01-28 DIAGNOSIS — K5732 Diverticulitis of large intestine without perforation or abscess without bleeding: Secondary | ICD-10-CM

## 2023-01-28 DIAGNOSIS — I10 Essential (primary) hypertension: Secondary | ICD-10-CM | POA: Insufficient documentation

## 2023-01-28 DIAGNOSIS — K5730 Diverticulosis of large intestine without perforation or abscess without bleeding: Secondary | ICD-10-CM | POA: Diagnosis not present

## 2023-01-28 DIAGNOSIS — K648 Other hemorrhoids: Secondary | ICD-10-CM | POA: Diagnosis not present

## 2023-01-28 HISTORY — PX: COLONOSCOPY WITH PROPOFOL: SHX5780

## 2023-01-28 LAB — HM COLONOSCOPY

## 2023-01-28 SURGERY — COLONOSCOPY WITH PROPOFOL
Anesthesia: General

## 2023-01-28 MED ORDER — LACTATED RINGERS IV SOLN
INTRAVENOUS | Status: DC
Start: 2023-01-28 — End: 2023-01-28

## 2023-01-28 MED ORDER — PROPOFOL 500 MG/50ML IV EMUL
INTRAVENOUS | Status: DC | PRN
  Administered 2023-01-28: 150 ug/kg/min via INTRAVENOUS

## 2023-01-28 MED ORDER — SODIUM CHLORIDE 0.9 % IV SOLN: INTRAVENOUS | Status: DC | PRN

## 2023-01-28 MED ORDER — PROPOFOL 10 MG/ML IV BOLUS
INTRAVENOUS | Status: DC | PRN
  Administered 2023-01-28: 40 mg via INTRAVENOUS
  Administered 2023-01-28: 100 mg via INTRAVENOUS

## 2023-01-28 MED ORDER — LIDOCAINE HCL (PF) 2 % IJ SOLN
INTRAMUSCULAR | Status: DC | PRN
  Administered 2023-01-28: 50 mg via INTRADERMAL

## 2023-01-28 NOTE — Anesthesia Preprocedure Evaluation (Signed)
Anesthesia Evaluation  Patient identified by MRN, date of birth, ID band Patient awake    Reviewed: Allergy & Precautions, H&P , NPO status , Patient's Chart, lab work & pertinent test results, reviewed documented beta blocker date and time   Airway Mallampati: II  TM Distance: >3 FB Neck ROM: full    Dental no notable dental hx.    Pulmonary neg pulmonary ROS   Pulmonary exam normal breath sounds clear to auscultation       Cardiovascular Exercise Tolerance: Good hypertension, negative cardio ROS  Rhythm:regular Rate:Normal     Neuro/Psych negative neurological ROS  negative psych ROS   GI/Hepatic negative GI ROS, Neg liver ROS,GERD  ,,  Endo/Other  negative endocrine ROS    Renal/GU negative Renal ROS  negative genitourinary   Musculoskeletal   Abdominal   Peds  Hematology negative hematology ROS (+)   Anesthesia Other Findings   Reproductive/Obstetrics negative OB ROS                             Anesthesia Physical Anesthesia Plan  ASA: 2  Anesthesia Plan: General   Post-op Pain Management:    Induction:   PONV Risk Score and Plan: Propofol infusion  Airway Management Planned:   Additional Equipment:   Intra-op Plan:   Post-operative Plan:   Informed Consent: I have reviewed the patients History and Physical, chart, labs and discussed the procedure including the risks, benefits and alternatives for the proposed anesthesia with the patient or authorized representative who has indicated his/her understanding and acceptance.     Dental Advisory Given  Plan Discussed with: CRNA  Anesthesia Plan Comments:        Anesthesia Quick Evaluation  

## 2023-01-28 NOTE — Transfer of Care (Signed)
Immediate Anesthesia Transfer of Care Note  Patient: Beth Lynch  Procedure(s) Performed: COLONOSCOPY WITH PROPOFOL  Patient Location: Endoscopy Unit  Anesthesia Type:General  Level of Consciousness: drowsy  Airway & Oxygen Therapy: Patient Spontanous Breathing  Post-op Assessment: Report given to RN and Post -op Vital signs reviewed and stable  Post vital signs: Reviewed and stable  Last Vitals:  Vitals Value Taken Time  BP    Temp    Pulse    Resp    SpO2      Last Pain:  Vitals:   01/28/23 0740  TempSrc:   PainSc: 0-No pain      Patients Stated Pain Goal: 7 (01/28/23 0701)  Complications: No notable events documented.

## 2023-01-28 NOTE — Anesthesia Postprocedure Evaluation (Signed)
Anesthesia Post Note  Patient: Beth Lynch  Procedure(s) Performed: COLONOSCOPY WITH PROPOFOL  Patient location during evaluation: Phase II Anesthesia Type: General Level of consciousness: awake Pain management: pain level controlled Vital Signs Assessment: post-procedure vital signs reviewed and stable Respiratory status: spontaneous breathing and respiratory function stable Cardiovascular status: blood pressure returned to baseline and stable Postop Assessment: no headache and no apparent nausea or vomiting Anesthetic complications: no Comments: Late entry   No notable events documented.   Last Vitals:  Vitals:   01/28/23 0803 01/28/23 0807  BP: (!) 82/47 95/62  Pulse: 67 70  Resp: 15 19  Temp: 36.6 C   SpO2: 96% 97%    Last Pain:  Vitals:   01/28/23 0807  TempSrc:   PainSc: 0-No pain                 [name]

## 2023-01-28 NOTE — Discharge Instructions (Signed)
You are being discharged to home.  Eat a high fiber diet.  Your physician has recommended a repeat colonoscopy in 10 years for screening purposes.  Start taking Benefiber 1 tablespoon daily.

## 2023-01-28 NOTE — Op Note (Signed)
[facility] Patient Name: Beth Lynch Procedure Date: 01/28/2023 7:07 AM MRN: [id number] Date of Birth: 21-Feb-1956 Attending MD: Katrinka Blazing , , [id number] CSN: [id number] Age: 67 Admit Type: Outpatient Procedure:                Colonoscopy Indications:              Follow-up of diverticulitis Providers:                Katrinka Blazing, Buel Ream. Thomasena Edis RN, RN, Elinor Parkinson Referring MD:              Medicines:                Monitored Anesthesia Care Complications:            No immediate complications. Estimated Blood Loss:     Estimated blood loss: none. Procedure:                Pre-Anesthesia Assessment:                           - Prior to the procedure, a History and Physical                            was performed, and patient medications, allergies                            and sensitivities were reviewed. The patient's                            tolerance of previous anesthesia was reviewed.                           - The risks and benefits of the procedure and the                            sedation options and risks were discussed with the                            patient. All questions were answered and informed                            consent was obtained.                           - ASA Grade Assessment: II - A patient with mild                            systemic disease.                           After obtaining informed consent, the colonoscope                            was passed under direct vision. Throughout the  procedure, the patient's blood pressure, pulse, and                            oxygen saturations were monitored continuously. The                            [id number]-HQ190L ([id number]) scope was introduced through                            the anus and advanced to the the cecum, identified                            by appendiceal orifice and ileocecal valve. The                             colonoscopy was performed without difficulty. The                            patient tolerated the procedure well. The quality                            of the bowel preparation was excellent. Scope In: 7:43:41 AM Scope Out: 8:00:45 AM Scope Withdrawal Time: 0 hours 11 minutes 54 seconds  Total Procedure Duration: 0 hours 17 minutes 4 seconds  Findings:      The perianal and digital rectal examinations were normal.      Scattered small-mouthed diverticula were found in the sigmoid colon.      Non-bleeding internal hemorrhoids were found during retroflexion. The       hemorrhoids were small. Impression:               - Diverticulosis in the sigmoid colon.                           - Non-bleeding internal hemorrhoids.                           - No specimens collected. Moderate Sedation:      Per Anesthesia Care Recommendation:           - Discharge patient to home (ambulatory).                           - High fiber diet.                           - Repeat colonoscopy in 10 years for screening                            purposes.                           - Start taking Benefiber 1 tablespoon daily to                            increase the bulk of the stool. Procedure Code(s):        --- Professional ---  09811, Colonoscopy, flexible; diagnostic, including                            collection of specimen(s) by brushing or washing,                            when performed (separate procedure) Diagnosis Code(s):        --- Professional ---                           K64.8, Other hemorrhoids                           K57.32, Diverticulitis of large intestine without                            perforation or abscess without bleeding                           K57.30, Diverticulosis of large intestine without                            perforation or abscess without bleeding CPT copyright 2022 American [employer] Association. All rights reserved. The codes  documented in this report are preliminary and upon coder review may  be revised to meet current compliance requirements. Katrinka Blazing, MD Katrinka Blazing,  01/28/2023 8:07:57 AM This report has been signed electronically. Number of Addenda: 0

## 2023-01-28 NOTE — Anesthesia Procedure Notes (Signed)
Date/Time: 01/28/2023 7:38 AM  Performed by: Julian Reil, CRNAPre-anesthesia Checklist: Patient identified, Emergency Drugs available, Suction available and Patient being monitored Patient Re-evaluated:Patient Re-evaluated prior to induction Oxygen Delivery Method: Nasal cannula Induction Type: IV induction Placement Confirmation: positive ETCO2

## 2023-01-28 NOTE — H&P (Signed)
Beth Lynch is an 67 y.o. female.   Chief Complaint: follow up diverticulitis HPI: Beth Lynch is a 67 y.o. female with past medical history of  thyroid disease, osteoporosis, who comes for follow-up of diverticulitis.  Patient reports that she is feeling better after receiving antibiotic treatment, although she is having 3 bowel movements per day.  No diarrhea.  No abdominal pain. The patient denies having any nausea, vomiting, fever, chills, hematochezia, melena, hematemesis, abdominal distention, jaundice, pruritus or weight loss.   Past Medical History:  Diagnosis Date   Multiple thyroid nodules    Thyroid disease    Wears glasses     Past Surgical History:  Procedure Laterality Date   BACK SURGERY  2014   lumb fusion   BIOPSY  07/21/2021   Procedure: BIOPSY;  Surgeon: Dolores Frame, MD;  Location: [facility];  Service: Gastroenterology;;   CARPOMETACARPAL [facility]) FUSION OF THUMB Left 12/28/2018   Procedure: FUSION METACARPAL PHANLAGEAL LEFT THUMB;  Surgeon: Betha Loa, MD;  Location: [facility];  Service: Orthopedics;  Laterality: Left;  AXILLARY BLOCK   CARPOMETACARPEL SUSPENSION PLASTY Left 03/17/2017   Procedure: SUSPENSIONPLASTY LEFT THUMB PARTIAL TRAPEZOID EXCISION ABDUCTOR POLLICIS LONGUS TRANSFER TRAPAZIUM EXCISION;  Surgeon: Cindee Salt, MD;  Location: [facility];  Service: Orthopedics;  Laterality: Left;  AXILLARY BLOCK   CHOLECYSTECTOMY     COLONOSCOPY     COLONOSCOPY N/A 04/28/2017   Procedure: COLONOSCOPY;  Surgeon: Malissa Hippo, MD;  Location: [facility];  Service: Endoscopy;  Laterality: N/A;  730   ESOPHAGOGASTRODUODENOSCOPY (EGD) WITH PROPOFOL N/A 07/21/2021   Procedure: ESOPHAGOGASTRODUODENOSCOPY (EGD) WITH PROPOFOL;  Surgeon: Dolores Frame, MD;  Location: [facility];  Service: Gastroenterology;  Laterality: N/A;  245 ASA 1   LIPOMA EXCISION     shoulder   TONSILLECTOMY     TRIGGER  FINGER RELEASE Right 12/27/2013   Procedure: RELEASE TRIGGER FINGER/A-1 PULLEY RIGHT MIDDLE FINGER;  Surgeon: Cindee Salt, MD;  Location: [facility];  Service: Orthopedics;  Laterality: Right;   TRIGGER FINGER RELEASE Left 02/17/2018   Procedure: RELEASE TRIGGER FINGER/A-1 PULLEY LEFT MIDDLE FINGER;  Surgeon: Cindee Salt, MD;  Location: [facility];  Service: Orthopedics;  Laterality: Left;   TUBAL LIGATION      Family History  Problem Relation Age of Onset   Breast cancer Mother    Breast cancer Maternal Aunt    Breast cancer Maternal Grandmother    Colon cancer Neg Hx    Social History:  reports that she has never smoked. She has never been exposed to tobacco smoke. She has never used smokeless tobacco. She reports current alcohol use. She reports that she does not use drugs.  Allergies:  Allergies  Allergen Reactions   Denosumab Shortness Of Breath, Itching and Rash    Chills, fever, headaches, GI upset, fatigue, Congestion, muscle pain, joint pain, bone pain, back pain, TMJ, vertigo   Shellfish Allergy Anaphylaxis   Alendronate Sodium Hives   Calcium-Containing Compounds Hives    No reaction to LR in the past per pt 02/07/18   Codeine Hives   Imitrex [Sumatriptan] Other (See Comments)    Breast lump   Latex Hives   Nsaids Hives   Betadine [Povidone Iodine] Rash   Hydrocodone Hives, Itching and Rash   Oxycodone Hives, Itching and Rash   Tetracycline Rash    Medications Prior to Admission  Medication Sig Dispense Refill   acetaminophen (TYLENOL) 500  MG tablet Take 500 mg by mouth every 6 (six) hours as needed.     Cholecalciferol (VITAMIN D) 50 MCG (2000 UT) tablet Take 2,000 Units by mouth at bedtime.     famotidine (PEPCID) 20 MG tablet Take 1 tablet (20 mg total) by mouth at bedtime. 30 tablet 3   fluticasone (FLONASE) 50 MCG/ACT nasal spray Place 1 spray into both nostrils as needed for allergies or rhinitis.     levocetirizine (XYZAL) 5 MG  tablet Take 5 mg by mouth at bedtime.      Sod Picosulfate-Mag Ox-Cit Acd (CLENPIQ) 10-3.5-12 MG-GM -GM/175ML SOLN Take 1 kit by mouth as directed. 350 mL 0    No results found for this or any previous visit (from the past 48 hour(s)). No results found.  Review of Systems  All other systems reviewed and are negative.   Blood pressure 132/78, pulse 61, temperature 97.9 F (36.6 C), temperature source Oral, resp. rate 17, height 5\' 5"  (1.651 m), weight 52.2 kg, SpO2 (!) 13%. Physical Exam  GENERAL: The patient is AO x3, in no acute distress. HEENT: Head is normocephalic and atraumatic. EOMI are intact. Mouth is well hydrated and without lesions. NECK: Supple. No masses LUNGS: Clear to auscultation. No presence of rhonchi/wheezing/rales. Adequate chest expansion HEART: RRR, normal s1 and s2. ABDOMEN: Soft, nontender, no guarding, no peritoneal signs, and nondistended. BS +. No masses. EXTREMITIES: Without any cyanosis, clubbing, rash, lesions or edema. NEUROLOGIC: AOx3, no focal motor deficit. SKIN: no jaundice, no rashes  Assessment/Plan Beth Lynch is a 67 y.o. female with past medical history of  thyroid disease, osteoporosis, who comes for follow-up of diverticulitis.  Will proceed with colonoscopy.  Dolores Frame, MD 01/28/2023, 7:32 AM

## 2023-02-01 ENCOUNTER — Encounter (INDEPENDENT_AMBULATORY_CARE_PROVIDER_SITE_OTHER): Payer: Self-pay | Admitting: *Deleted

## 2023-02-07 ENCOUNTER — Encounter (HOSPITAL_COMMUNITY): Payer: Self-pay | Admitting: Gastroenterology

## 2023-02-15 ENCOUNTER — Telehealth: Payer: Medicare PPO | Admitting: Family Medicine

## 2023-02-15 DIAGNOSIS — U071 COVID-19: Secondary | ICD-10-CM | POA: Diagnosis not present

## 2023-02-15 MED ORDER — NIRMATRELVIR/RITONAVIR (PAXLOVID) TABLET (RENAL DOSING): 2.0000 | ORAL_TABLET | Freq: Two times a day (BID) | ORAL | 0 refills | Status: AC

## 2023-02-15 NOTE — Patient Instructions (Signed)

## 2023-02-15 NOTE — Progress Notes (Signed)
Virtual Visit Consent   Beth Lynch, you are scheduled for a virtual visit with a [facility] provider today. Just as with appointments in the office, your consent must be obtained to participate. Your consent will be active for this visit and any virtual visit you may have with one of our providers in the next 365 days. If you have a MyChart account, a copy of this consent can be sent to you electronically.  As this is a virtual visit, video technology does not allow for your provider to perform a traditional examination. This may limit your provider's ability to fully assess your condition. If your provider identifies any concerns that need to be evaluated in person or the need to arrange testing (such as labs, EKG, etc.), we will make arrangements to do so. Although advances in technology are sophisticated, we cannot ensure that it will always work on either your end or our end. If the connection with a video visit is poor, the visit may have to be switched to a telephone visit. With either a video or telephone visit, we are not always able to ensure that we have a secure connection.  By engaging in this virtual visit, you consent to the provision of healthcare and authorize for your insurance to be billed (if applicable) for the services provided during this visit. Depending on your insurance coverage, you may receive a charge related to this service.  I need to obtain your verbal consent now. Are you willing to proceed with your visit today? Beth Lynch has provided verbal consent on 02/15/2023 for a virtual visit (video or telephone). Beth Curio, FNP  Date: 02/15/2023 8:27 AM  Virtual Visit via Video Note   I, Beth Lynch, connected with  Beth Lynch  ([id number], [birth year]) on 02/15/23 at  8:30 AM EST by a video-enabled telemedicine application and verified that I am speaking with the correct person using two identifiers.  Location: Patient: Home Provider: Virtual  Visit Location Provider: Home Office   I discussed the limitations of evaluation and management by telemedicine and the availability of in person appointments. The patient expressed understanding and agreed to proceed.    History of Present Illness: Beth Lynch is a 67 y.o. who identifies as a female who was assigned female at birth, and is being seen today for sinus pressure and pain, no cough or wheezing, fever, headache since yesterday worsening. Req paxlovid. Family also has this aling with 48 week old granddaughter. Marland Kitchen  HPI: HPI  Problems:  Patient Active Problem List   Diagnosis Date Noted   Diverticulitis of colon 12/14/2022   Gastroesophageal reflux disease without esophagitis 12/14/2022   Hoarseness of voice 10/28/2022   Left lower quadrant abdominal pain 10/28/2022   H/O multiple allergies 05/12/2021   Abdominal pain, right upper quadrant 05/12/2021   Vomiting without nausea 05/12/2021   Diarrhea 05/12/2021   Special screening for malignant neoplasms, colon 07/15/2016   FOOT PAIN, RIGHT 06/28/2007    Allergies:  Allergies  Allergen Reactions   Denosumab Shortness Of Breath, Itching and Rash    Chills, fever, headaches, GI upset, fatigue, Congestion, muscle pain, joint pain, bone pain, back pain, TMJ, vertigo   Shellfish Allergy Anaphylaxis   Alendronate Sodium Hives   Calcium-Containing Compounds Hives    No reaction to LR in the past per pt 02/07/18   Codeine Hives   Imitrex [Sumatriptan] Other (See Comments)    Breast lump   Latex Hives   Nsaids Hives  Betadine [Povidone Iodine] Rash   Hydrocodone Hives, Itching and Rash   Oxycodone Hives, Itching and Rash   Tetracycline Rash   Medications:  Current Outpatient Medications:    acetaminophen (TYLENOL) 500 MG tablet, Take 500 mg by mouth every 6 (six) hours as needed., Disp: , Rfl:    Cholecalciferol (VITAMIN D) 50 MCG (2000 UT) tablet, Take 2,000 Units by mouth at bedtime., Disp: , Rfl:    famotidine  (PEPCID) 20 MG tablet, Take 1 tablet (20 mg total) by mouth at bedtime., Disp: 30 tablet, Rfl: 3   fluticasone (FLONASE) 50 MCG/ACT nasal spray, Place 1 spray into both nostrils as needed for allergies or rhinitis., Disp: , Rfl:    levocetirizine (XYZAL) 5 MG tablet, Take 5 mg by mouth at bedtime. , Disp: , Rfl:   Observations/Objective: Patient is well-developed, well-nourished in no acute distress.  Resting comfortably  at home.  Head is normocephalic, atraumatic.  No labored breathing.  Speech is clear and coherent with logical content.  Patient is alert and oriented at baseline.    Assessment and Plan: 1. COVID (Primary)  Increase fluids, VIT C D and zinc, tylenol or ibuprofen as directed, quarantine discussed. ,in no distress, UC if sx worsen.   Follow Up Instructions: I discussed the assessment and treatment plan with the patient. The patient was provided an opportunity to ask questions and all were answered. The patient agreed with the plan and demonstrated an understanding of the instructions.  A copy of instructions were sent to the patient via MyChart unless otherwise noted below.     The patient was advised to call back or seek an in-person evaluation if the symptoms worsen or if the condition fails to improve as anticipated.    Beth Curio, FNP

## 2023-02-28 ENCOUNTER — Telehealth: Payer: Medicare PPO | Admitting: Physician Assistant

## 2023-02-28 DIAGNOSIS — T3695XA Adverse effect of unspecified systemic antibiotic, initial encounter: Secondary | ICD-10-CM

## 2023-02-28 DIAGNOSIS — B9689 Other specified bacterial agents as the cause of diseases classified elsewhere: Secondary | ICD-10-CM | POA: Diagnosis not present

## 2023-02-28 DIAGNOSIS — B379 Candidiasis, unspecified: Secondary | ICD-10-CM

## 2023-02-28 DIAGNOSIS — J019 Acute sinusitis, unspecified: Secondary | ICD-10-CM | POA: Diagnosis not present

## 2023-02-28 MED ORDER — AMOXICILLIN-POT CLAVULANATE 875-125 MG PO TABS: 1.0000 | ORAL_TABLET | Freq: Two times a day (BID) | ORAL | 0 refills | Status: DC

## 2023-02-28 MED ORDER — FLUCONAZOLE 150 MG PO TABS: 150.0000 mg | ORAL_TABLET | ORAL | 0 refills | Status: DC | PRN

## 2023-02-28 NOTE — Patient Instructions (Signed)
 Slater Karna Dess, thank you for joining Delon CHRISTELLA Dickinson, PA-C for today's virtual visit.  While this provider is not your primary care provider (PCP), if your PCP is located in our provider database this encounter information will be shared with them immediately following your visit.   A [facility] MyChart account gives you access to today's visit and all your visits, tests, and labs performed at [facility]  click here if you don't have a [facility] MyChart account or go to [email or web address]  Consent: (Patient) Beth Lynch provided verbal consent for this virtual visit at the beginning of the encounter.  Current Medications:  Current Outpatient Medications:    amoxicillin -clavulanate (AUGMENTIN ) 875-125 MG tablet, Take 1 tablet by mouth 2 (two) times daily., Disp: 14 tablet, Rfl: 0   fluconazole  (DIFLUCAN ) 150 MG tablet, Take 1 tablet (150 mg total) by mouth every 3 (three) days as needed., Disp: 2 tablet, Rfl: 0   acetaminophen  (TYLENOL ) 500 MG tablet, Take 500 mg by mouth every 6 (six) hours as needed., Disp: , Rfl:    Cholecalciferol (VITAMIN D) 50 MCG (2000 UT) tablet, Take 2,000 Units by mouth at bedtime., Disp: , Rfl:    famotidine  (PEPCID ) 20 MG tablet, Take 1 tablet (20 mg total) by mouth at bedtime., Disp: 30 tablet, Rfl: 3   fluticasone (FLONASE) 50 MCG/ACT nasal spray, Place 1 spray into both nostrils as needed for allergies or rhinitis., Disp: , Rfl:    levocetirizine (XYZAL) 5 MG tablet, Take 5 mg by mouth at bedtime. , Disp: , Rfl:    Medications ordered in this encounter:  Meds ordered this encounter  Medications   amoxicillin -clavulanate (AUGMENTIN ) 875-125 MG tablet    Sig: Take 1 tablet by mouth 2 (two) times daily.    Dispense:  14 tablet    Refill:  0    Supervising Provider:   LAMPTEY, PHILIP O [[id number]]   fluconazole  (DIFLUCAN ) 150 MG tablet    Sig: Take 1 tablet (150 mg total) by mouth every 3 (three) days as needed.     Dispense:  2 tablet    Refill:  0    Supervising Provider:   LAMPTEY, PHILIP O [[id number]]     *If you need refills on other medications prior to your next appointment, please contact your pharmacy*  Follow-Up: Call back or seek an in-person evaluation if the symptoms worsen or if the condition fails to improve as anticipated.  [facility] [phone]  Other Instructions  Sinus Infection, Adult A sinus infection, also called sinusitis, is inflammation of your sinuses. Sinuses are hollow spaces in the bones around your face. Your sinuses are located: Around your eyes. In the middle of your forehead. Behind your nose. In your cheekbones. Mucus normally drains out of your sinuses. When your nasal tissues become inflamed or swollen, mucus can become trapped or blocked. This allows bacteria, viruses, and fungi to grow, which leads to infection. Most infections of the sinuses are caused by a virus. A sinus infection can develop quickly. It can last for up to 4 weeks (acute) or for more than 12 weeks (chronic). A sinus infection often develops after a cold. What are the causes? This condition is caused by anything that creates swelling in the sinuses or stops mucus from draining. This includes: Allergies. Asthma. Infection from bacteria or viruses. Deformities or blockages in your nose or sinuses. Abnormal growths in the nose (nasal polyps). Pollutants, such as chemicals or irritants in the air.  Infection from fungi. This is rare. What increases the risk? You are more likely to develop this condition if you: Have a weak body defense system (immune system). Do a lot of swimming or diving. Overuse nasal sprays. Smoke. What are the signs or symptoms? The main symptoms of this condition are pain and a feeling of pressure around the affected sinuses. Other symptoms include: Stuffy nose or congestion that makes it difficult to breathe through your nose. Thick yellow or  greenish drainage from your nose. Tenderness, swelling, and warmth over the affected sinuses. A cough that may get worse at night. Decreased sense of smell and taste. Extra mucus that collects in the throat or the back of the nose (postnasal drip) causing a sore throat or bad breath. Tiredness (fatigue). Fever. How is this diagnosed? This condition is diagnosed based on: Your symptoms. Your medical history. A physical exam. Tests to find out if your condition is acute or chronic. This may include: Checking your nose for nasal polyps. Viewing your sinuses using a device that has a light (endoscope). Testing for allergies or bacteria. Imaging tests, such as an MRI or CT scan. In rare cases, a bone biopsy may be done to rule out more serious types of fungal sinus disease. How is this treated? Treatment for a sinus infection depends on the cause and whether your condition is chronic or acute. If caused by a virus, your symptoms should go away on their own within 10 days. You may be given medicines to relieve symptoms. They include: Medicines that shrink swollen nasal passages (decongestants). A spray that eases inflammation of the nostrils (topical intranasal corticosteroids). Rinses that help get rid of thick mucus in your nose (nasal saline washes). Medicines that treat allergies (antihistamines). Over-the-counter pain relievers. If caused by bacteria, your health care provider may recommend waiting to see if your symptoms improve. Most bacterial infections will get better without antibiotic medicine. You may be given antibiotics if you have: A severe infection. A weak immune system. If caused by narrow nasal passages or nasal polyps, surgery may be needed. Follow these instructions at home: Medicines Take, use, or apply over-the-counter and prescription medicines only as told by your health care provider. These may include nasal sprays. If you were prescribed an antibiotic medicine,  take it as told by your health care provider. Do not stop taking the antibiotic even if you start to feel better. Hydrate and humidify  Drink enough fluid to keep your urine pale yellow. Staying hydrated will help to thin your mucus. Use a cool mist humidifier to keep the humidity level in your home above 50%. Inhale steam for 10-15 minutes, 3-4 times a day, or as told by your health care provider. You can do this in the bathroom while a hot shower is running. Limit your exposure to cool or dry air. Rest Rest as much as possible. Sleep with your head raised (elevated). Make sure you get enough sleep each night. General instructions  Apply a warm, moist washcloth to your face 3-4 times a day or as told by your health care provider. This will help with discomfort. Use nasal saline washes as often as told by your health care provider. Wash your hands often with soap and water to reduce your exposure to germs. If soap and water are not available, use hand sanitizer. Do not smoke. Avoid being around people who are smoking (secondhand smoke). Keep all follow-up visits. This is important. Contact a health care provider if: You have  a fever. Your symptoms get worse. Your symptoms do not improve within 10 days. Get help right away if: You have a severe headache. You have persistent vomiting. You have severe pain or swelling around your face or eyes. You have vision problems. You develop confusion. Your neck is stiff. You have trouble breathing. These symptoms may be an emergency. Get help right away. Call 911. Do not wait to see if the symptoms will go away. Do not drive yourself to the hospital. Summary A sinus infection is soreness and inflammation of your sinuses. Sinuses are hollow spaces in the bones around your face. This condition is caused by nasal tissues that become inflamed or swollen. The swelling traps or blocks the flow of mucus. This allows bacteria, viruses, and fungi to  grow, which leads to infection. If you were prescribed an antibiotic medicine, take it as told by your health care provider. Do not stop taking the antibiotic even if you start to feel better. Keep all follow-up visits. This is important. This information is not intended to replace advice given to you by your health care provider. Make sure you discuss any questions you have with your health care provider. Document Revised: 01/13/2021 Document Reviewed: 01/13/2021 Elsevier Patient Education  2024 [employer]vier [employer].    If you have been instructed to have an in-person evaluation today at a local Urgent Care facility, please use the link below. It will take you to a list of all of our available [facility], including address, phone number and hours of operation. Please do not delay care.  [facility]  If you or a family member do not have a primary care provider, use the link below to schedule a visit and establish care. When you choose a [facility] primary care physician or advanced practice provider, you gain a long-term partner in health. Find a Primary Care Provider  Learn more about [facility]'s in-office and virtual care options: [facility] - Get Care Now

## 2023-02-28 NOTE — Progress Notes (Signed)
 Virtual Visit Consent   Beth Lynch, you are scheduled for a virtual visit with a [facility] provider today. Just as with appointments in the office, your consent must be obtained to participate. Your consent will be active for this visit and any virtual visit you may have with one of our providers in the next 365 days. If you have a MyChart account, a copy of this consent can be sent to you electronically.  As this is a virtual visit, video technology does not allow for your provider to perform a traditional examination. This may limit your provider's ability to fully assess your condition. If your provider identifies any concerns that need to be evaluated in person or the need to arrange testing (such as labs, EKG, etc.), we will make arrangements to do so. Although advances in technology are sophisticated, we cannot ensure that it will always work on either your end or our end. If the connection with a video visit is poor, the visit may have to be switched to a telephone visit. With either a video or telephone visit, we are not always able to ensure that we have a secure connection.  By engaging in this virtual visit, you consent to the provision of healthcare and authorize for your insurance to be billed (if applicable) for the services provided during this visit. Depending on your insurance coverage, you may receive a charge related to this service.  I need to obtain your verbal consent now. Are you willing to proceed with your visit today? Beth Lynch has provided verbal consent on 02/28/2023 for a virtual visit (video or telephone). Delon CHRISTELLA Dickinson, PA-C  Date: 02/28/2023 11:10 AM  Virtual Visit via Video Note   I, Delon CHRISTELLA Dickinson, connected with  Beth Lynch  ([id number], [id number]) on 02/28/23 at 10:15 AM EST by a video-enabled telemedicine application and verified that I am speaking with the correct person using two identifiers.  Location: Patient: Virtual  Visit Location Patient: Home Provider: Virtual Visit Location Provider: Home Office   I discussed the limitations of evaluation and management by telemedicine and the availability of in person appointments. The patient expressed understanding and agreed to proceed.    History of Present Illness: Beth Lynch is a 68 y.o. who identifies as a female who was assigned female at birth, and is being seen today for continued URI symptoms.  HPI: HPI  Covid 19 positive on 02/15/23. Seen Virtually and treated with Paxlovid . She completed treatment. Also ended up with a UTI. By day 5 of Paxlovid  she finally had her fever break. Had fatigue still but started to improve a couple days later. Had complete improvements in symptoms. Then Saturday started to have sinus congestion and pain, hoarse voice/laryngitis symptoms. She did retest for Covid 19 and was positive.  Problems:  Patient Active Problem List   Diagnosis Date Noted   Diverticulitis of colon 12/14/2022   Gastroesophageal reflux disease without esophagitis 12/14/2022   Hoarseness of voice 10/28/2022   Left lower quadrant abdominal pain 10/28/2022   H/O multiple allergies 05/12/2021   Abdominal pain, right upper quadrant 05/12/2021   Vomiting without nausea 05/12/2021   Diarrhea 05/12/2021   Special screening for malignant neoplasms, colon 07/15/2016   FOOT PAIN, RIGHT 06/28/2007    Allergies:  Allergies  Allergen Reactions   Denosumab Shortness Of Breath, Itching and Rash    Chills, fever, headaches, GI upset, fatigue, Congestion, muscle pain, joint pain, bone pain, back pain, TMJ, vertigo  Shellfish Allergy Anaphylaxis   Alendronate Sodium Hives   Calcium-Containing Compounds Hives    No reaction to LR in the past per pt 02/07/18   Codeine Hives   Imitrex [Sumatriptan] Other (See Comments)    Breast lump   Latex Hives   Nsaids Hives   Betadine [Povidone Iodine] Rash   Hydrocodone  Hives, Itching and Rash   Oxycodone Hives,  Itching and Rash   Tetracycline Rash   Medications:  Current Outpatient Medications:    amoxicillin -clavulanate (AUGMENTIN ) 875-125 MG tablet, Take 1 tablet by mouth 2 (two) times daily., Disp: 14 tablet, Rfl: 0   fluconazole  (DIFLUCAN ) 150 MG tablet, Take 1 tablet (150 mg total) by mouth every 3 (three) days as needed., Disp: 2 tablet, Rfl: 0   acetaminophen  (TYLENOL ) 500 MG tablet, Take 500 mg by mouth every 6 (six) hours as needed., Disp: , Rfl:    Cholecalciferol (VITAMIN D) 50 MCG (2000 UT) tablet, Take 2,000 Units by mouth at bedtime., Disp: , Rfl:    famotidine  (PEPCID ) 20 MG tablet, Take 1 tablet (20 mg total) by mouth at bedtime., Disp: 30 tablet, Rfl: 3   fluticasone (FLONASE) 50 MCG/ACT nasal spray, Place 1 spray into both nostrils as needed for allergies or rhinitis., Disp: , Rfl:    levocetirizine (XYZAL) 5 MG tablet, Take 5 mg by mouth at bedtime. , Disp: , Rfl:   Observations/Objective: Patient is well-developed, well-nourished in no acute distress.  Resting comfortably at home.  Head is normocephalic, atraumatic.  No labored breathing.  Speech is clear and coherent with logical content.  Patient is alert and oriented at baseline.    Assessment and Plan: 1. Acute bacterial sinusitis (Primary) - amoxicillin -clavulanate (AUGMENTIN ) 875-125 MG tablet; Take 1 tablet by mouth 2 (two) times daily.  Dispense: 14 tablet; Refill: 0  2. Antibiotic-induced yeast infection - fluconazole  (DIFLUCAN ) 150 MG tablet; Take 1 tablet (150 mg total) by mouth every 3 (three) days as needed.  Dispense: 2 tablet; Refill: 0  - Possible secondary sinus infection following Covid 19, however, rebound Covid 19 cannot be ruled out - Will give Augmentin  - Isolation guidelines discussed, continue to mask - Continue allergy medications.  - Steam and humidifier can help - Stay well hydrated and get plenty of rest.  - Diflucan  given as prophylaxis as patient tends to get vaginal yeast infections with  antibiotic use. - Seek in person evaluation if no symptom improvement or if symptoms worsen   Follow Up Instructions: I discussed the assessment and treatment plan with the patient. The patient was provided an opportunity to ask questions and all were answered. The patient agreed with the plan and demonstrated an understanding of the instructions.  A copy of instructions were sent to the patient via MyChart unless otherwise noted below.    The patient was advised to call back or seek an in-person evaluation if the symptoms worsen or if the condition fails to improve as anticipated.    Delon CHRISTELLA Dickinson, PA-C

## 2023-03-04 ENCOUNTER — Other Ambulatory Visit (INDEPENDENT_AMBULATORY_CARE_PROVIDER_SITE_OTHER): Payer: Self-pay | Admitting: Gastroenterology

## 2023-03-08 DIAGNOSIS — H2513 Age-related nuclear cataract, bilateral: Secondary | ICD-10-CM | POA: Diagnosis not present

## 2023-03-08 DIAGNOSIS — H35361 Drusen (degenerative) of macula, right eye: Secondary | ICD-10-CM | POA: Diagnosis not present

## 2023-03-08 DIAGNOSIS — H43813 Vitreous degeneration, bilateral: Secondary | ICD-10-CM | POA: Diagnosis not present

## 2023-03-08 DIAGNOSIS — H04123 Dry eye syndrome of bilateral lacrimal glands: Secondary | ICD-10-CM | POA: Diagnosis not present

## 2023-03-08 DIAGNOSIS — H5213 Myopia, bilateral: Secondary | ICD-10-CM | POA: Diagnosis not present

## 2023-03-08 DIAGNOSIS — H25013 Cortical age-related cataract, bilateral: Secondary | ICD-10-CM | POA: Diagnosis not present

## 2023-05-04 DIAGNOSIS — M810 Age-related osteoporosis without current pathological fracture: Secondary | ICD-10-CM | POA: Diagnosis not present

## 2023-05-04 DIAGNOSIS — N390 Urinary tract infection, site not specified: Secondary | ICD-10-CM | POA: Diagnosis not present

## 2023-05-04 DIAGNOSIS — Z01419 Encounter for gynecological examination (general) (routine) without abnormal findings: Secondary | ICD-10-CM | POA: Diagnosis not present

## 2023-05-04 DIAGNOSIS — Z681 Body mass index (BMI) 19 or less, adult: Secondary | ICD-10-CM | POA: Diagnosis not present

## 2023-05-04 DIAGNOSIS — Z1231 Encounter for screening mammogram for malignant neoplasm of breast: Secondary | ICD-10-CM | POA: Diagnosis not present

## 2023-05-05 DIAGNOSIS — J383 Other diseases of vocal cords: Secondary | ICD-10-CM | POA: Diagnosis not present

## 2023-05-05 DIAGNOSIS — R490 Dysphonia: Secondary | ICD-10-CM | POA: Diagnosis not present

## 2023-05-05 DIAGNOSIS — J385 Laryngeal spasm: Secondary | ICD-10-CM | POA: Diagnosis not present

## 2023-05-05 DIAGNOSIS — J381 Polyp of vocal cord and larynx: Secondary | ICD-10-CM | POA: Diagnosis not present

## 2023-05-19 DIAGNOSIS — J381 Polyp of vocal cord and larynx: Secondary | ICD-10-CM | POA: Diagnosis not present

## 2023-05-19 DIAGNOSIS — R490 Dysphonia: Secondary | ICD-10-CM | POA: Diagnosis not present

## 2023-05-19 DIAGNOSIS — J383 Other diseases of vocal cords: Secondary | ICD-10-CM | POA: Diagnosis not present

## 2023-05-19 DIAGNOSIS — J385 Laryngeal spasm: Secondary | ICD-10-CM | POA: Diagnosis not present

## 2023-05-24 DIAGNOSIS — J383 Other diseases of vocal cords: Secondary | ICD-10-CM | POA: Diagnosis not present

## 2023-05-24 DIAGNOSIS — R490 Dysphonia: Secondary | ICD-10-CM | POA: Diagnosis not present

## 2023-05-24 DIAGNOSIS — J381 Polyp of vocal cord and larynx: Secondary | ICD-10-CM | POA: Diagnosis not present

## 2023-05-24 DIAGNOSIS — J385 Laryngeal spasm: Secondary | ICD-10-CM | POA: Diagnosis not present

## 2023-07-08 ENCOUNTER — Other Ambulatory Visit (INDEPENDENT_AMBULATORY_CARE_PROVIDER_SITE_OTHER): Payer: Self-pay | Admitting: Gastroenterology

## 2023-07-12 DIAGNOSIS — H2513 Age-related nuclear cataract, bilateral: Secondary | ICD-10-CM | POA: Diagnosis not present

## 2023-07-12 DIAGNOSIS — H25013 Cortical age-related cataract, bilateral: Secondary | ICD-10-CM | POA: Diagnosis not present

## 2023-07-12 DIAGNOSIS — H35361 Drusen (degenerative) of macula, right eye: Secondary | ICD-10-CM | POA: Diagnosis not present

## 2023-07-12 DIAGNOSIS — H04123 Dry eye syndrome of bilateral lacrimal glands: Secondary | ICD-10-CM | POA: Diagnosis not present

## 2023-09-20 DIAGNOSIS — L920 Granuloma annulare: Secondary | ICD-10-CM | POA: Diagnosis not present

## 2023-09-20 DIAGNOSIS — D225 Melanocytic nevi of trunk: Secondary | ICD-10-CM | POA: Diagnosis not present

## 2023-09-20 DIAGNOSIS — D692 Other nonthrombocytopenic purpura: Secondary | ICD-10-CM | POA: Diagnosis not present

## 2023-10-10 DIAGNOSIS — H25011 Cortical age-related cataract, right eye: Secondary | ICD-10-CM | POA: Diagnosis not present

## 2023-10-10 DIAGNOSIS — H25811 Combined forms of age-related cataract, right eye: Secondary | ICD-10-CM | POA: Diagnosis not present

## 2023-10-10 DIAGNOSIS — H2511 Age-related nuclear cataract, right eye: Secondary | ICD-10-CM | POA: Diagnosis not present

## 2023-10-10 DIAGNOSIS — Z961 Presence of intraocular lens: Secondary | ICD-10-CM | POA: Diagnosis not present

## 2023-10-17 DIAGNOSIS — H25812 Combined forms of age-related cataract, left eye: Secondary | ICD-10-CM | POA: Diagnosis not present

## 2023-10-17 DIAGNOSIS — H2512 Age-related nuclear cataract, left eye: Secondary | ICD-10-CM | POA: Diagnosis not present

## 2023-10-17 DIAGNOSIS — Z961 Presence of intraocular lens: Secondary | ICD-10-CM | POA: Diagnosis not present

## 2023-10-17 DIAGNOSIS — H25012 Cortical age-related cataract, left eye: Secondary | ICD-10-CM | POA: Diagnosis not present

## 2023-11-04 ENCOUNTER — Other Ambulatory Visit (INDEPENDENT_AMBULATORY_CARE_PROVIDER_SITE_OTHER): Payer: Self-pay | Admitting: Gastroenterology

## 2023-11-15 DIAGNOSIS — Z961 Presence of intraocular lens: Secondary | ICD-10-CM | POA: Diagnosis not present

## 2023-12-07 ENCOUNTER — Encounter (INDEPENDENT_AMBULATORY_CARE_PROVIDER_SITE_OTHER): Payer: Self-pay | Admitting: Gastroenterology

## 2023-12-15 ENCOUNTER — Encounter (INDEPENDENT_AMBULATORY_CARE_PROVIDER_SITE_OTHER): Payer: Self-pay

## 2023-12-15 ENCOUNTER — Ambulatory Visit (INDEPENDENT_AMBULATORY_CARE_PROVIDER_SITE_OTHER): Payer: Medicare PPO | Admitting: Gastroenterology

## 2023-12-27 ENCOUNTER — Other Ambulatory Visit (INDEPENDENT_AMBULATORY_CARE_PROVIDER_SITE_OTHER): Payer: Self-pay | Admitting: Gastroenterology

## 2023-12-27 ENCOUNTER — Telehealth (INDEPENDENT_AMBULATORY_CARE_PROVIDER_SITE_OTHER): Payer: Self-pay

## 2023-12-27 MED ORDER — FAMOTIDINE 20 MG PO TABS: 20.0000 mg | ORAL_TABLET | Freq: Every day | ORAL | 0 refills | Status: DC

## 2023-12-27 NOTE — Telephone Encounter (Signed)
 I spoke with the patient and made her aware we have sent the medication into the requested pharmacy, and we gave her enough to last until her appointment.

## 2023-12-27 NOTE — Telephone Encounter (Signed)
 Patient is scheduled for 02/06/2024 appointment and is requesting enough refills on famotidine  to last her until that appointment. She was last seen on 12/14/2023 and had a TCS done on 02/17/2023. Please advise if you will send in enough famotidine  20 mg at bedtime to last until the appointment, which is scheduled on 02/06/2024. Patient uses [facility]. Please advise.

## 2024-02-06 ENCOUNTER — Encounter (INDEPENDENT_AMBULATORY_CARE_PROVIDER_SITE_OTHER): Payer: Self-pay | Admitting: Gastroenterology

## 2024-02-06 ENCOUNTER — Ambulatory Visit (INDEPENDENT_AMBULATORY_CARE_PROVIDER_SITE_OTHER): Admitting: Gastroenterology

## 2024-02-06 VITALS — BP 105/68 | HR 71 | Temp 98.0°F | Ht 66.0 in | Wt 126.7 lb

## 2024-02-06 DIAGNOSIS — K219 Gastro-esophageal reflux disease without esophagitis: Secondary | ICD-10-CM

## 2024-02-06 DIAGNOSIS — R490 Dysphonia: Secondary | ICD-10-CM

## 2024-02-06 DIAGNOSIS — Z8719 Personal history of other diseases of the digestive system: Secondary | ICD-10-CM | POA: Insufficient documentation

## 2024-02-06 MED ORDER — FAMOTIDINE 20 MG PO TABS: 20.0000 mg | ORAL_TABLET | Freq: Every day | ORAL | 3 refills | Status: AC

## 2024-02-06 MED ORDER — AMOXICILLIN-POT CLAVULANATE 875-125 MG PO TABS: ORAL_TABLET | ORAL | 0 refills | Status: AC

## 2024-02-06 NOTE — Progress Notes (Addendum)
 Referring Provider: Marvine Rush, MD Primary Care Physician:  Marvine Rush, MD Primary GI Physician: Dr. Eartha   Chief Complaint  Patient presents with   Follow-up    Pt arrives for follow up. Doing Famotidine  for acid reflux.  Has had no bouts of diverticulitis. Has had episode of nausea/vomiting in the spring. Patient would like to talk to provider about medication for diverticulitis. PCP gave her med to use when traveling.    HPI:   Beth Lynch is a 68 y.o. female with past medical history of thyroid  disease, GERD, osteoporosis, diverticulitis   Patient presenting today for:  GERD with hoarseness follow up History of diverticulitis   Last seen October 2024, at that time doing well. Having some hoarseness, improved with famotidien 20mg  at bedtime. Abdominal pain and bloating improved with cipro  and flagyl    Recommended avoid NSAIDS, schedule colonoscopy, continue famotidine  20mg  at bedtime  Present:  States she had one episode  few months back where she had vomiting and diarrhea, she notes some mid back pain, towards right side. Denies abdominal pain. She states that she had symptoms like this prior to her GB removal. She has had no further episodes since this.   She requested a possible standing Rx for  diverticulitis in case she has another episode while she is traveling. PCP gave her moxifloxacin. She inquires about alternatives to cipro  and flagyl  as she did not tolerate these well in the past  Reflux is well controlled on famotidine  20mg  at bedtime. She feels that hoarseness is well controlled. She does feel that breads sometimes pass a little slower.    Last Colonoscopy:01/28/23- Diverticulosis in the sigmoid colon.                           - Non-bleeding internal hemorrhoids.                           - No specimens collected. Last Endoscopy: 2023 duodenal diverticula   Repeat colonoscopy 10 years  Filed Weights   02/06/24 0819  Weight: 126 lb 11.2 oz  (57.5 kg)     Past Medical History:  Diagnosis Date   Multiple thyroid  nodules    Thyroid  disease    Wears glasses     Past Surgical History:  Procedure Laterality Date   BACK SURGERY  2014   lumb fusion   BIOPSY  07/21/2021   Procedure: BIOPSY;  Surgeon: Eartha Angelia Sieving, MD;  [facility]: [facility];  Service: Gastroenterology;;   CARPOMETACARPAL [facility]) FUSION OF THUMB Left 12/28/2018   Procedure: FUSION METACARPAL PHANLAGEAL LEFT THUMB;  Surgeon: Murrell Drivers, MD;  Location: [facility];  Service: Orthopedics;  Laterality: Left;  AXILLARY BLOCK   CARPOMETACARPEL SUSPENSION PLASTY Left 03/17/2017   Procedure: SUSPENSIONPLASTY LEFT THUMB PARTIAL TRAPEZOID EXCISION ABDUCTOR POLLICIS LONGUS TRANSFER TRAPAZIUM EXCISION;  Surgeon: Murrell Kuba, MD;  Location: [facility];  Service: Orthopedics;  Laterality: Left;  AXILLARY BLOCK   CHOLECYSTECTOMY     COLONOSCOPY     COLONOSCOPY N/A 04/28/2017   Procedure: COLONOSCOPY;  Surgeon: Golda Claudis PENNER, MD;  Location: [facility];  Service: Endoscopy;  Laterality: N/A;  730   COLONOSCOPY WITH PROPOFOL  N/A 01/28/2023   Procedure: COLONOSCOPY WITH PROPOFOL ;  Surgeon: Eartha Angelia Sieving, MD;  Location: [facility];  Service: Gastroenterology;  Laterality: N/A;  7:30am;asa 2   ESOPHAGOGASTRODUODENOSCOPY (EGD) WITH PROPOFOL  N/A  07/21/2021   Procedure: ESOPHAGOGASTRODUODENOSCOPY (EGD) WITH PROPOFOL ;  Surgeon: Eartha Angelia Sieving, MD;  Location: [facility];  Service: Gastroenterology;  Laterality: N/A;  245 ASA 1   LIPOMA EXCISION     shoulder   TONSILLECTOMY     TRIGGER FINGER RELEASE Right 12/27/2013   Procedure: RELEASE TRIGGER FINGER/A-1 PULLEY RIGHT MIDDLE FINGER;  Surgeon: Arley Curia, MD;  Location: [facility];  Service: Orthopedics;  Laterality: Right;   TRIGGER FINGER RELEASE Left 02/17/2018   Procedure: RELEASE TRIGGER FINGER/A-1 PULLEY LEFT MIDDLE FINGER;  Surgeon: Curia Arley,  MD;  Location: [facility];  Service: Orthopedics;  Laterality: Left;   TUBAL LIGATION      Current Outpatient Medications  Medication Sig Dispense Refill   Cholecalciferol (VITAMIN D) 50 MCG (2000 UT) tablet Take 2,000 Units by mouth at bedtime. (Patient taking differently: Take 1,000 Units by mouth at bedtime.)     famotidine  (PEPCID ) 20 MG tablet Take 1 tablet (20 mg total) by mouth at bedtime. 45 tablet 0   fluticasone (FLONASE) 50 MCG/ACT nasal spray Place 1 spray into both nostrils as needed for allergies or rhinitis.     Ibuprofen-Acetaminophen  (ADVIL DUAL ACTION PO) Take by mouth.     levocetirizine (XYZAL) 5 MG tablet Take 5 mg by mouth at bedtime.      moxifloxacin (AVELOX) 400 MG tablet 1 tablet Orally Once a day; Duration: 10 days     nitrofurantoin, macrocrystal-monohydrate, (MACROBID) 100 MG capsule 1 capsule with food Oral twice a day; Duration: 7 days     No current facility-administered medications for this visit.    Allergies as of 02/06/2024 - Review Complete 02/06/2024  Allergen Reaction Noted   Denosumab Shortness Of Breath, Itching, and Rash 04/04/2020   Shellfish allergy Anaphylaxis 12/25/2013   Alendronate sodium Hives 05/19/2010   Calcium-containing compounds Hives 05/19/2010   Codeine Hives 12/25/2013   Imitrex [sumatriptan] Other (See Comments) 03/04/2012   Latex Hives 12/25/2013   Nsaids Hives 12/25/2013   Betadine [povidone iodine] Rash 12/25/2013   Hydrocodone  Hives, Itching, and Rash 12/27/2013   Other Itching and Rash 02/06/2024   Oxycodone Hives, Itching, and Rash 12/27/2013   Tetracycline Rash     Social History   Socioeconomic History   Marital status: Married    Spouse name: Not on file   Number of children: Not on file   Years of education: Not on file   Highest education level: Not on file  Occupational History   Not on file  Tobacco Use   Smoking status: Never    Passive exposure: Never   Smokeless tobacco: Never   Vaping Use   Vaping status: Never Used  Substance and Sexual Activity   Alcohol use: Yes    Comment: rare   Drug use: No   Sexual activity: Never  Other Topics Concern   Not on file  Social History Narrative   Not on file   Social Drivers of Health   Tobacco Use: Low Risk (02/06/2024)   Patient History    Smoking Tobacco Use: Never    Smokeless Tobacco Use: Never    Passive Exposure: Never  Financial Resource Strain: Not on file  Food Insecurity: Not on file  Transportation Needs: Not on file  Physical Activity: Not on file  Stress: Not on file  Social Connections: Not on file  Depression (EYV7-0): Not on file  Alcohol Screen: Not on file  Housing: Not on file  Utilities: Not on file  Health Literacy: Not on file    Review of systems General: negative for malaise, night sweats, fever, chills, weight loss Neck: Negative for lumps, goiter, pain and significant neck swelling Resp: Negative for cough, wheezing, dyspnea at rest CV: Negative for chest pain, leg swelling, palpitations, orthopnea GI: denies melena, hematochezia, nausea, vomiting, diarrhea, constipation, dysphagia, odyonophagia, early satiety or unintentional weight loss.  MSK: Negative for joint pain or swelling, back pain, and muscle pain. Derm: Negative for itching or rash Psych: Denies depression, anxiety, memory loss, confusion. No homicidal or suicidal ideation.  Heme: Negative for prolonged bleeding, bruising easily, and swollen nodes. Endocrine: Negative for cold or heat intolerance, polyuria, polydipsia and goiter. Neuro: negative for tremor, gait imbalance, syncope and seizures. The remainder of the review of systems is noncontributory.  Physical Exam: BP 105/68   Pulse 71   Temp 98 F (36.7 C)   Ht 5' 6 (1.676 m)   Wt 126 lb 11.2 oz (57.5 kg)   BMI 20.45 kg/m  General:   Alert and oriented. No distress noted. Pleasant and cooperative.  Head:  Normocephalic and atraumatic. Eyes:   Conjuctiva clear without scleral icterus. Mouth:  Oral mucosa pink and moist. Good dentition. No lesions. Heart: Normal rate and rhythm, s1 and s2 heart sounds present.  Lungs: Clear lung sounds in all lobes. Respirations equal and unlabored. Abdomen:  +BS, soft, non-tender and non-distended. No rebound or guarding. No HSM or masses noted. Derm: No palmar erythema or jaundice Msk:  Symmetrical without gross deformities. Normal posture. Extremities:  Without edema. Neurologic:  Alert and  oriented x4 Psych:  Alert and cooperative. Normal mood and affect.  Invalid input(s): 6 MONTHS   ASSESSMENT: Marceil Welp is a 68 y.o. female presenting today for follow up of GERD with hoarseness, history of diverticulitis  GERD: previously with more hoarseness, grade A reflux esophagitis on EGD in 2023. Has done well on famotidine  with good control of symptoms. She has occasional sensation that breads are slower to pass when eating but otherwise no real dysphagia. She feels this is manageable and will make me aware if this worsens as we can discuss EGD at that time  History of diverticulitis: episode of uncomplicated diverticulitis in 2024. She had follow up colonoscopy thereafter. No further confirmed episodes. She inquires about having a standing prescription for antibiotics if she were to have another episode as she travels out of town a lot and is going to [place] in the next few months. We discussed importance of only utilizing antibiotics when absolutely necessary. As she has had confirmed episode and travels frequently, I will send augmentin  (she did not tolerate cipro  and flagyl  previously), for her to use if she feels symptoms of diverticulitis though she needs to make me aware if she has to use this. We discussed taking otc probiotic when using antibiotic therapy as well, with instructions to take during course and up to 7-10 days thereafter and 3-4 hours apart from antibiotic therapy.     PLAN:  -continue famoitinde 20mg  -Rx augmentin  BID x10 days, take only if she develops symptoms of diverticulitis   All questions were answered, patient verbalized understanding and is in agreement with plan as outlined above.    Follow Up: 1 year   Ellee Wawrzyniak L. Mariette, MSN, APRN, AGNP-C Adult-Gerontology Nurse Practitioner [facility]   I have reviewed the note and agree with the APP's assessment as described in this progress note  Toribio Fortune, MD Gastroenterology and Hepatology  [facility]

## 2024-02-06 NOTE — Patient Instructions (Signed)
 Continue famotidine  20mg  every night for reflux I have sent augmentin  to take if you develop symptoms of diverticulitis while traveling, please take this twice a day for the entire 10 days and make me aware if you have to use this If you need the augmentin , make sure to take an otc probiotic as we discussed during therapy and for 7-10 days after, about 3-4 hours apart from the antibiotic  Follow up 1 year  It was a pleasure to see you today. I want to create trusting relationships with patients and provide genuine, compassionate, and quality care. I truly value your feedback! please be on the lookout for a survey regarding your visit with me today. I appreciate your input about our visit and your time in completing this!    Aldric Wenzler L. Graden Hoshino, MSN, APRN, AGNP-C Adult-Gerontology Nurse Practitioner [facility]
